# Patient Record
Sex: Female | Born: 1955 | Race: White | Hispanic: No | Marital: Married | Smoking: Never smoker
Health system: Southern US, Community
[De-identification: ages and names within clinical notes are randomized; demographics above are authoritative.]

## PROBLEM LIST (undated history)

## (undated) DIAGNOSIS — M8580 Other specified disorders of bone density and structure, unspecified site: Secondary | ICD-10-CM

## (undated) DIAGNOSIS — F32A Depression, unspecified: Secondary | ICD-10-CM

## (undated) DIAGNOSIS — T7840XA Allergy, unspecified, initial encounter: Secondary | ICD-10-CM

## (undated) DIAGNOSIS — I10 Essential (primary) hypertension: Secondary | ICD-10-CM

## (undated) DIAGNOSIS — M1990 Unspecified osteoarthritis, unspecified site: Secondary | ICD-10-CM

## (undated) DIAGNOSIS — N289 Disorder of kidney and ureter, unspecified: Secondary | ICD-10-CM

## (undated) DIAGNOSIS — E119 Type 2 diabetes mellitus without complications: Secondary | ICD-10-CM

## (undated) DIAGNOSIS — F419 Anxiety disorder, unspecified: Secondary | ICD-10-CM

## (undated) HISTORY — DX: Other specified disorders of bone density and structure, unspecified site: M85.80

## (undated) HISTORY — PX: APPENDECTOMY: SHX54

## (undated) HISTORY — PX: TONSILLECTOMY: SUR1361

## (undated) HISTORY — DX: Unspecified osteoarthritis, unspecified site: M19.90

## (undated) HISTORY — PX: MOUTH SURGERY: SHX715

## (undated) HISTORY — DX: Depression, unspecified: F32.A

## (undated) HISTORY — DX: Allergy, unspecified, initial encounter: T78.40XA

## (undated) HISTORY — DX: Type 2 diabetes mellitus without complications: E11.9

## (undated) HISTORY — PX: KNEE SURGERY: SHX244

## (undated) HISTORY — PX: ABLATION: SHX5711

## (undated) HISTORY — DX: Anxiety disorder, unspecified: F41.9

## (undated) HISTORY — PX: DILATION AND CURETTAGE OF UTERUS: SHX78

---

## 2019-12-16 ENCOUNTER — Emergency Department (HOSPITAL_COMMUNITY): Payer: BC Managed Care – PPO

## 2019-12-16 ENCOUNTER — Emergency Department (HOSPITAL_COMMUNITY)
Admission: EM | Admit: 2019-12-16 | Discharge: 2019-12-16 | Disposition: A | Discharge status: Home / Self Care | Payer: BC Managed Care – PPO | Attending: Emergency Medicine | Admitting: Emergency Medicine

## 2019-12-16 DIAGNOSIS — R531 Weakness: Secondary | ICD-10-CM | POA: Diagnosis not present

## 2019-12-16 DIAGNOSIS — R5381 Other malaise: Secondary | ICD-10-CM | POA: Diagnosis not present

## 2019-12-16 DIAGNOSIS — N189 Chronic kidney disease, unspecified: Secondary | ICD-10-CM | POA: Diagnosis not present

## 2019-12-16 DIAGNOSIS — R202 Paresthesia of skin: Secondary | ICD-10-CM | POA: Diagnosis present

## 2019-12-16 DIAGNOSIS — R200 Anesthesia of skin: Secondary | ICD-10-CM

## 2019-12-16 LAB — CBC WITH DIFFERENTIAL/PLATELET
Abs Immature Granulocytes: 0.01 10*3/uL (ref 0.00–0.07)
Basophils Absolute: 0 10*3/uL (ref 0.0–0.1)
Basophils Relative: 1 %
Eosinophils Absolute: 0.1 10*3/uL (ref 0.0–0.5)
Eosinophils Relative: 2 %
HCT: 37.2 % (ref 36.0–46.0)
Hemoglobin: 12.4 g/dL (ref 12.0–15.0)
Immature Granulocytes: 0 %
Lymphocytes Relative: 26 %
Lymphs Abs: 1.4 10*3/uL (ref 0.7–4.0)
MCH: 30.2 pg (ref 26.0–34.0)
MCHC: 33.3 g/dL (ref 30.0–36.0)
MCV: 90.7 fL (ref 80.0–100.0)
Monocytes Absolute: 0.4 10*3/uL (ref 0.1–1.0)
Monocytes Relative: 6 %
Neutro Abs: 3.6 10*3/uL (ref 1.7–7.7)
Neutrophils Relative %: 65 %
Platelets: 282 10*3/uL (ref 150–400)
RBC: 4.1 MIL/uL (ref 3.87–5.11)
RDW: 12.2 % (ref 11.5–15.5)
WBC: 5.5 10*3/uL (ref 4.0–10.5)
nRBC: 0 % (ref 0.0–0.2)

## 2019-12-16 LAB — BASIC METABOLIC PANEL
Anion gap: 10 (ref 5–15)
BUN: 25 mg/dL — ABNORMAL HIGH (ref 8–23)
CO2: 25 mmol/L (ref 22–32)
Calcium: 9.5 mg/dL (ref 8.9–10.3)
Chloride: 100 mmol/L (ref 98–111)
Creatinine, Ser: 1.88 mg/dL — ABNORMAL HIGH (ref 0.44–1.00)
GFR, Estimated: 29 mL/min — ABNORMAL LOW (ref >60)
Glucose, Bld: 155 mg/dL — ABNORMAL HIGH (ref 70–99)
Potassium: 4.2 mmol/L (ref 3.5–5.1)
Sodium: 135 mmol/L (ref 135–145)

## 2019-12-16 LAB — I-STAT CHEM 8, ED
BUN: 27 mg/dL — ABNORMAL HIGH (ref 8–23)
Calcium, Ion: 1.19 mmol/L (ref 1.15–1.40)
Chloride: 101 mmol/L (ref 98–111)
Creatinine, Ser: 2.1 mg/dL — ABNORMAL HIGH (ref 0.44–1.00)
Glucose, Bld: 152 mg/dL — ABNORMAL HIGH (ref 70–99)
HCT: 36 % (ref 36.0–46.0)
Hemoglobin: 12.2 g/dL (ref 12.0–15.0)
Potassium: 4.2 mmol/L (ref 3.5–5.1)
Sodium: 136 mmol/L (ref 135–145)
TCO2: 24 mmol/L (ref 22–32)

## 2019-12-16 MED ORDER — PREDNISONE 20 MG PO TABS: ORAL_TABLET | ORAL | 0 refills | Status: DC

## 2019-12-16 MED ORDER — LORAZEPAM 2 MG/ML IJ SOLN
0.5000 mg | Freq: Once | INTRAMUSCULAR | Status: AC
  Administered 2019-12-16: 0.5 mg via INTRAVENOUS
  Filled 2019-12-16: qty 1

## 2019-12-16 NOTE — Discharge Instructions (Signed)
You may have Bell's palsy so please try a course of steroids.  Of note your kidney function is slightly worse than previous.  Please follow-up with [facility]  You will need primary care doctor follow-up as well.  Please see who is in your network  Return to ER if you have worse numbness, weakness, trouble swallowing, trouble speaking

## 2019-12-16 NOTE — ED Provider Notes (Signed)
  Physical Exam  BP (!) 149/54 (BP Location: Right Arm)   Pulse 71   Temp 98.2 F (36.8 C) (Oral)   Resp 17   Ht 5\' 4"  (1.626 m)   Wt 73.5 kg   SpO2 95%   BMI 27.81 kg/m   Physical Exam  ED Course/Procedures   Clinical Course as of Dec 15 1657  Sun Dec 16, 2019  1349 Patient's laboratory tests are notable for elevated BUN and creatinine.  Patient does have a history of chronic kidney disease.   [JK]  1349 Doubt that this is related to acute issues.  No prior labs are available for comparison   [JK]  1350 CBC is normal.   [JK]  1350 Head CT without acute findings.   [JK]    Clinical Course User Index [JK] Dec 18, 2019, MD    Procedures  MDM  Care assumed at 3 PM from Dr. Linwood Dibbles.  Patient has right-sided facial numbness.  Patient is pending MRI brain and if is negative likely discharge home.  5:00 PM MRI did not show any acute changes.  Patient still has some numbness in the right side of her face.  There is no trouble closing her eyes or facial weakness.  I suspect mild Bell's palsy.  We will try a course of steroids.  Of note patient's creatinine is 2.1.  She lives in [place].  I was able to look at her creatinine through my chart and her baseline creatinine is 1.6.  Since she spends part of the time here with her daughter, I recommend that she follow-up with nephrology here.  She should also get a PCP in the area.     [place], MD 12/16/19 [phone]

## 2019-12-16 NOTE — ED Notes (Signed)
Patient and daughter verbalized understanding of AVS, f/u with nephrology and PCP, medication, and have no further questions at this time. Patient plans to call erh nephrologist in [facility] tomorrow morning. Patient is alert, oriented and reports no pain. Patient and daughter ambulated toward ED exit with all belongings.

## 2019-12-16 NOTE — ED Provider Notes (Signed)
[facility] EMERGENCY DEPARTMENT Provider Note   CSN: [id number] Arrival date & time: 12/16/19  1136     History Chief Complaint  Patient presents with  . Numbness  . Extremity Weakness    Bilateral     Jenna Caldwell is a 64 y.o. female.  HPI   Patient presents ED with complaints of right-sided facial numbness.  Patient states when she first woke up this morning she noticed that the right side of her face felt numb.  Felt as if she had been given an injection of Novocain at the dentist office.  She checked in the mirror but did not see any evidence of asymmetry of her face.  Patient also had a feeling of generalized malaise and weakness.  She felt that her entire body including bilateral lateral arms felt weak.  She also had some upset stomach with nausea but has not had any vomiting.  She denies any abdominal pain or chest pain.  She denies any headache.  No trouble with her speech or vision.  Patient asked her daughter to come over because she was not feeling well.  Her daughter recommended she come to the hospital to be checked out  Past medical history: Chronic kidney disease  Social history: No drugs or alcohol use  Family history: Noncontributory     OB History   No obstetric history on file.     No family history on file.  Social History   Tobacco Use  . Smoking status: Not on file  Substance Use Topics  . Alcohol use: Not on file  . Drug use: Not on file    Home Medications Prior to Admission medications   Not on File    Allergies    Patient has no allergy information on record.  Review of Systems   Review of Systems  All other systems reviewed and are negative.   Physical Exam Updated Vital Signs BP (!) 149/54 (BP Location: Right Arm)   Pulse 71   Temp 98.2 F (36.8 C) (Oral)   Resp 17   Ht 1.626 m (5\' 4" )   Wt 73.5 kg   SpO2 95%   BMI 27.81 kg/m   Physical Exam Vitals and nursing note reviewed.  Constitutional:       General: She is not in acute distress.    Appearance: She is well-developed.  HENT:     Head: Normocephalic and atraumatic.     Right Ear: External ear normal.     Left Ear: External ear normal.  Eyes:     General: No scleral icterus.       Right eye: No discharge.        Left eye: No discharge.     Conjunctiva/sclera: Conjunctivae normal.  Neck:     Trachea: No tracheal deviation.  Cardiovascular:     Rate and Rhythm: Normal rate and regular rhythm.  Pulmonary:     Effort: Pulmonary effort is normal. No respiratory distress.     Breath sounds: Normal breath sounds. No stridor. No wheezing or rales.  Abdominal:     General: Bowel sounds are normal. There is no distension.     Palpations: Abdomen is soft.     Tenderness: There is no abdominal tenderness. There is no guarding or rebound.  Musculoskeletal:        General: No tenderness.     Cervical back: Neck supple.  Skin:    General: Skin is warm and dry.  Findings: No rash.  Neurological:     Mental Status: She is alert and oriented to person, place, and time.     Cranial Nerves: No cranial nerve deficit (No facial droop, extraocular movements intact, tongue midline ).     Sensory: No sensory deficit.     Motor: No abnormal muscle tone or seizure activity.     Coordination: Coordination normal.     Comments: No pronator drift bilateral upper extrem, able to hold both legs off bed for 5 seconds, sensation intact in all extremities, no visual field cuts, no left or right sided neglect, normal finger-nose exam bilaterally, no nystagmus noted      ED Results / Procedures / Treatments   Labs (all labs ordered are listed, but only abnormal results are displayed) Labs Reviewed  BASIC METABOLIC PANEL - Abnormal; Notable for the following components:      Result Value   Glucose, Bld 155 (*)    BUN 25 (*)    Creatinine, Ser 1.88 (*)    GFR, Estimated 29 (*)    All other components within normal limits  I-STAT CHEM 8, ED -  Abnormal; Notable for the following components:   BUN 27 (*)    Creatinine, Ser 2.10 (*)    Glucose, Bld 152 (*)    All other components within normal limits  CBC WITH DIFFERENTIAL/PLATELET    EKG EKG Interpretation  Date/Time:  Sunday December 16 2019 11:40:42 EDT Ventricular Rate:  96 PR Interval:  186 QRS Duration: 70 QT Interval:  338 QTC Calculation: 427 R Axis:   70 Text Interpretation: Normal sinus rhythm Normal ECG No old tracing to compare Confirmed by Linwood Dibbles [phone]) on 12/16/2019 12:09:21 PM   Radiology CT Head Wo Contrast  Result Date: 12/16/2019 CLINICAL DATA:  Transient ischemic attack.  Numbness. EXAM: CT HEAD WITHOUT CONTRAST TECHNIQUE: Contiguous axial images were obtained from the base of the skull through the vertex without intravenous contrast. COMPARISON:  None. FINDINGS: Brain: No evidence of acute infarction, hemorrhage, hydrocephalus, extra-axial collection or mass lesion/mass effect. Vascular: No hyperdense vessel or unexpected calcification. Skull: Normal. Negative for fracture or focal lesion. Sinuses/Orbits: No acute finding. Other: None. IMPRESSION: Normal study. Electronically Signed   By: Gerome Sam III M.D   On: 12/16/2019 13:09    Procedures Procedures (including critical care time)  Medications Ordered in ED Medications  LORazepam (ATIVAN) injection 0.5 mg (0.5 mg Intravenous Given 12/16/19 1523)    ED Course  I have reviewed the triage vital signs and the nursing notes.  Pertinent labs & imaging results that were available during my care of the patient were reviewed by me and considered in my medical decision making (see chart for details).  Clinical Course as of Dec 16 1539  Sun Dec 16, 2019  1349 Patient's laboratory tests are notable for elevated BUN and creatinine.  Patient does have a history of chronic kidney disease.   [JK]  1349 Doubt that this is related to acute issues.  No prior labs are available for comparison   [JK]   1350 CBC is normal.   [JK]  1350 Head CT without acute findings.   [JK]    Clinical Course User Index [JK] Linwood Dibbles, MD   MDM Rules/Calculators/A&P                          Patient presented to the emergency room with complaints of facial numbness.  Patient had some  generalized malaise.  In the ED the patient's exam is reassuring.  She does not have any focal neurologic deficits other than subjective numbness in her right cheek.  Laboratory tests and head CT otherwise unremarkable.  Discussed findings with patient.  I do think an MRI is indicated to rule out any occult stroke not evident on the CT scan.  Patient agrees to this plan.  If her MRI is negative anticipate she can be discharged.  Dr Silverio Lay to follow up on MRI Final Clinical Impression(s) / ED Diagnoses Final diagnoses:  Facial numbness      Linwood Dibbles, MD 12/16/19 1542

## 2019-12-16 NOTE — ED Notes (Signed)
Patient to MRI on stretcher w transport tech. Daughter has all jewelry. Patient alert and oriented. Pre medicated for anxiolysis.

## 2019-12-16 NOTE — ED Triage Notes (Signed)
Reported numbness on right side of face, and feels right side of mouth droopy. "It's like when novacaine is wearing off" however denies any recent dentl procedure. Patient c/o bilateral arm weakness as well; and "my stomach is upset". Stated symptoms started around 0930 this morning.

## 2020-03-10 ENCOUNTER — Emergency Department (HOSPITAL_BASED_OUTPATIENT_CLINIC_OR_DEPARTMENT_OTHER)
Admission: EM | Admit: 2020-03-10 | Discharge: 2020-03-10 | Disposition: A | Discharge status: Home / Self Care | Payer: BC Managed Care – PPO | Source: Home / Self Care | Attending: Emergency Medicine | Admitting: Emergency Medicine

## 2020-03-10 ENCOUNTER — Emergency Department (HOSPITAL_BASED_OUTPATIENT_CLINIC_OR_DEPARTMENT_OTHER): Payer: BC Managed Care – PPO

## 2020-03-10 ENCOUNTER — Encounter (HOSPITAL_COMMUNITY): Payer: Self-pay | Admitting: *Deleted

## 2020-03-10 ENCOUNTER — Emergency Department (HOSPITAL_COMMUNITY)
Admission: EM | Admit: 2020-03-10 | Discharge: 2020-03-10 | Disposition: A | Discharge status: Home / Self Care | Payer: BC Managed Care – PPO | Attending: Emergency Medicine | Admitting: Emergency Medicine

## 2020-03-10 ENCOUNTER — Encounter (HOSPITAL_BASED_OUTPATIENT_CLINIC_OR_DEPARTMENT_OTHER): Payer: Self-pay | Admitting: *Deleted

## 2020-03-10 ENCOUNTER — Other Ambulatory Visit: Payer: Self-pay

## 2020-03-10 DIAGNOSIS — I10 Essential (primary) hypertension: Secondary | ICD-10-CM | POA: Insufficient documentation

## 2020-03-10 DIAGNOSIS — S01511A Laceration without foreign body of lip, initial encounter: Secondary | ICD-10-CM | POA: Insufficient documentation

## 2020-03-10 DIAGNOSIS — Z79899 Other long term (current) drug therapy: Secondary | ICD-10-CM | POA: Insufficient documentation

## 2020-03-10 DIAGNOSIS — Z5321 Procedure and treatment not carried out due to patient leaving prior to being seen by health care provider: Secondary | ICD-10-CM | POA: Insufficient documentation

## 2020-03-10 DIAGNOSIS — E871 Hypo-osmolality and hyponatremia: Secondary | ICD-10-CM | POA: Diagnosis not present

## 2020-03-10 DIAGNOSIS — R55 Syncope and collapse: Secondary | ICD-10-CM | POA: Insufficient documentation

## 2020-03-10 DIAGNOSIS — W19XXXA Unspecified fall, initial encounter: Secondary | ICD-10-CM | POA: Diagnosis not present

## 2020-03-10 DIAGNOSIS — S0993XA Unspecified injury of face, initial encounter: Secondary | ICD-10-CM | POA: Diagnosis present

## 2020-03-10 HISTORY — DX: Disorder of kidney and ureter, unspecified: N28.9

## 2020-03-10 HISTORY — DX: Essential (primary) hypertension: I10

## 2020-03-10 LAB — COMPREHENSIVE METABOLIC PANEL
ALT: 10 U/L (ref 0–44)
AST: 15 U/L (ref 15–41)
Albumin: 4.1 g/dL (ref 3.5–5.0)
Alkaline Phosphatase: 57 U/L (ref 38–126)
Anion gap: 10 (ref 5–15)
BUN: 21 mg/dL (ref 8–23)
CO2: 24 mmol/L (ref 22–32)
Calcium: 8.8 mg/dL — ABNORMAL LOW (ref 8.9–10.3)
Chloride: 93 mmol/L — ABNORMAL LOW (ref 98–111)
Creatinine, Ser: 1.73 mg/dL — ABNORMAL HIGH (ref 0.44–1.00)
GFR, Estimated: 32 mL/min — ABNORMAL LOW (ref >60)
Glucose, Bld: 134 mg/dL — ABNORMAL HIGH (ref 70–99)
Potassium: 4.3 mmol/L (ref 3.5–5.1)
Sodium: 127 mmol/L — ABNORMAL LOW (ref 135–145)
Total Bilirubin: 0.5 mg/dL (ref 0.3–1.2)
Total Protein: 6.6 g/dL (ref 6.5–8.1)

## 2020-03-10 LAB — CBC WITH DIFFERENTIAL/PLATELET
Abs Immature Granulocytes: 0.03 10*3/uL (ref 0.00–0.07)
Basophils Absolute: 0 10*3/uL (ref 0.0–0.1)
Basophils Relative: 0 %
Eosinophils Absolute: 0 10*3/uL (ref 0.0–0.5)
Eosinophils Relative: 0 %
HCT: 34.5 % — ABNORMAL LOW (ref 36.0–46.0)
Hemoglobin: 12.3 g/dL (ref 12.0–15.0)
Immature Granulocytes: 0 %
Lymphocytes Relative: 8 %
Lymphs Abs: 0.7 10*3/uL (ref 0.7–4.0)
MCH: 30.9 pg (ref 26.0–34.0)
MCHC: 35.7 g/dL (ref 30.0–36.0)
MCV: 86.7 fL (ref 80.0–100.0)
Monocytes Absolute: 0.4 10*3/uL (ref 0.1–1.0)
Monocytes Relative: 5 %
Neutro Abs: 7.4 10*3/uL (ref 1.7–7.7)
Neutrophils Relative %: 87 %
Platelets: 274 10*3/uL (ref 150–400)
RBC: 3.98 MIL/uL (ref 3.87–5.11)
RDW: 11.5 % (ref 11.5–15.5)
WBC: 8.6 10*3/uL (ref 4.0–10.5)
nRBC: 0 % (ref 0.0–0.2)

## 2020-03-10 LAB — BASIC METABOLIC PANEL
Anion gap: 11 (ref 5–15)
BUN: 16 mg/dL (ref 8–23)
CO2: 22 mmol/L (ref 22–32)
Calcium: 9 mg/dL (ref 8.9–10.3)
Chloride: 93 mmol/L — ABNORMAL LOW (ref 98–111)
Creatinine, Ser: 1.69 mg/dL — ABNORMAL HIGH (ref 0.44–1.00)
GFR, Estimated: 33 mL/min — ABNORMAL LOW (ref >60)
Glucose, Bld: 142 mg/dL — ABNORMAL HIGH (ref 70–99)
Potassium: 4.1 mmol/L (ref 3.5–5.1)
Sodium: 126 mmol/L — ABNORMAL LOW (ref 135–145)

## 2020-03-10 LAB — CBC
HCT: 36.3 % (ref 36.0–46.0)
Hemoglobin: 12.3 g/dL (ref 12.0–15.0)
MCH: 30.4 pg (ref 26.0–34.0)
MCHC: 33.9 g/dL (ref 30.0–36.0)
MCV: 89.9 fL (ref 80.0–100.0)
Platelets: 264 10*3/uL (ref 150–400)
RBC: 4.04 MIL/uL (ref 3.87–5.11)
RDW: 11.5 % (ref 11.5–15.5)
WBC: 8.1 10*3/uL (ref 4.0–10.5)
nRBC: 0 % (ref 0.0–0.2)

## 2020-03-10 LAB — TROPONIN I (HIGH SENSITIVITY)
Troponin I (High Sensitivity): <2 ng/L (ref <18)
Troponin I (High Sensitivity): <2 ng/L (ref <18)

## 2020-03-10 LAB — LIPASE, BLOOD: Lipase: 32 U/L (ref 11–51)
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
## 2020-03-10 NOTE — ED Triage Notes (Signed)
Pt reports ongoing dizziness when showering, occurs when lifting her hands above her head. Has sensation she has to have bowel movement. Normally able to get out of shower and lay down. Today had +syncopal episode while in shower. Has abrasions noted to nose and laceration to upper lip.

## 2020-03-10 NOTE — ED Notes (Signed)
Pt on monitor and vitals cycling 

## 2020-03-23 NOTE — Progress Notes (Signed)
Cardiology Office Note:    Date:  03/25/2020   ID:  Jenna Caldwell, DOB 04/22/1955, MRN [id number]  PCP:  Patient, No Pcp Per  [facility] Cardiologist:  Meriam Sprague, MD  [facility] Electrophysiologist:  None   Referring MD: Laurence Spates, *    History of Present Illness:    Jenna Caldwell is a 65 y.o. female with a hx of HTN, pre-diabetes, and CKD who was referred by Frederick Peers for further evaluation of syncope.  Patient recently presented to [facility] ED with possible syncope after feeling lightheaded and dizzy in the shower. Prior to passing out, she felt nauseas and like she had a BM. No chest pain, SOB, palpitations. No known cardiac issues. In the ER, CT head negative for acute pathology. Carotid ultrasound without dissection or significant stenosis. Was referred to Cards for further management.  Today, the patient states that she frequently feels nauseas/lightheaded in the shower especially when she would bend back to wash her hair. On several occasions, she had to get out of the shower and lay down to prevent her from losing consciousness. This is not unusual for her as she has had similar symptoms throughout her life. Specifically, she has almost fainted in the physician's office when reviewing a procedure or imaging test. This time was just different because she lost consciousness. Notably in the ER her Na was low at 127. She is on a no salt/sugar diet due to CKD and attempts to lose weight. After her ER visit, she was instructed to increase her salt intake a little bit and repeat labs showed improvement of her Na to 138. The patient is very concerned about her renal function and does not want to do anything to disrupt her electrolytes or creatinine.   Otherwise, she has no known personal history of heart disease. She is active without exertional symptoms. She does have a history of palpitations and holter performed in [place] was reassuringly normal. She was placed on  metoprolol at that time with complete resolution. Her blood pressure medication has also been lowered as she has lost 46lbs over the past 1.5 years.  Otherwise, no DOE, orthopnea, PND, fevers or chills.  Family history: Mother with history of DMII, MI at age 74; Grandmother with MI after delivery of her child at age 31, passed away from heart failure.   TC 174, TG 138, HDL 57, LDL 93 Na now 138, K 4.6, BUN 21  Past Medical History:  Diagnosis Date  . Hypertension   . Renal disease     History reviewed. No pertinent surgical history.  Current Medications: Current Meds  Medication Sig  . Calcium 200 MG TABS Take 2 tablets by mouth daily at 12 noon.  . Cholecalciferol (VITAMIN D3 PO) Take 1 tablet by mouth daily at 12 noon.  . Cyanocobalamin (VITAMIN B 12 PO) Take 1 tablet by mouth daily at 12 noon.  Marland Kitchen imipramine (TOFRANIL) 50 MG tablet Take 1 tablet by mouth daily.  Marland Kitchen lisinopril (ZESTRIL) 2.5 MG tablet Take 2.5 mg by mouth daily.  Marland Kitchen METOPROLOL SUCCINATE PO Take 25 mg by mouth.     Allergies:   Metformin and related and Sulfa antibiotics   Social History   Socioeconomic History  . Marital status: Married    Spouse name: Not on file  . Number of children: Not on file  . Years of education: Not on file  . Highest education level: Not on file  Occupational History  . Not  on file  Tobacco Use  . Smoking status: Never Smoker  . Smokeless tobacco: Never Used  Substance and Sexual Activity  . Alcohol use: Never  . Drug use: Never  . Sexual activity: Not on file  Other Topics Concern  . Not on file  Social History Narrative  . Not on file   Social Determinants of Health   Financial Resource Strain: Not on file  Food Insecurity: Not on file  Transportation Needs: Not on file  Physical Activity: Not on file  Stress: Not on file  Social Connections: Not on file     Family History: Mother with history of DMII, MI at age 22; Grandmother with MI after delivery of her child  at age 62, passed away from heart failure.   ROS:   Please see the history of present illness.    Review of Systems  Constitutional: Negative for chills and fever.  HENT: Negative for nosebleeds.   Eyes: Negative for blurred vision.  Respiratory: Negative for sputum production and shortness of breath.   Cardiovascular: Negative for chest pain, palpitations, orthopnea, claudication, leg swelling and PND.  Gastrointestinal: Positive for nausea. Negative for blood in stool and vomiting.  Genitourinary: Negative for dysuria.  Musculoskeletal: Positive for joint pain.  Neurological: Positive for dizziness and loss of consciousness.  Endo/Heme/Allergies: Negative for polydipsia.  Psychiatric/Behavioral: Negative for substance abuse.    EKGs/Labs/Other Studies Reviewed:    The following studies were reviewed today: Carotid Ultrasound 03/10/20: FINDINGS: Criteria: Quantification of carotid stenosis is based on velocity parameters that correlate the residual internal carotid diameter with NASCET-based stenosis levels, using the diameter of the distal internal carotid lumen as the denominator for stenosis measurement.  The following velocity measurements were obtained:  RIGHT ICA: 84/27 cm/sec CCA: 86/17 cm/sec  SYSTOLIC ICA/CCA RATIO:  1.0  ECA:  71 cm/sec  LEFT  ICA: 77/31 cm/sec  CCA: 100/26 cm/sec  SYSTOLIC ICA/CCA RATIO:  0.8  ECA:  84 cm/sec  RIGHT CAROTID ARTERY: No significant atherosclerotic plaque or evidence of stenosis in the internal carotid artery.  RIGHT VERTEBRAL ARTERY:  Patent with normal antegrade flow.  LEFT CAROTID ARTERY: No significant atherosclerotic plaque or stenosis in the internal carotid artery.  LEFT VERTEBRAL ARTERY:  Patent with normal antegrade flow.  IMPRESSION: Negative bilateral carotid duplex ultrasound.  EKG:  EKG 03/11/20: NSR with no ischemia or block  Recent Labs: 03/10/2020: ALT 10; BUN 21; Creatinine, Ser  1.73; Hemoglobin 12.3; Platelets 274; Potassium 4.3; Sodium 127  Recent Lipid Panel No results found for: CHOL, TRIG, HDL, CHOLHDL, VLDL, LDLCALC, LDLDIRECT2   Physical Exam:    VS:  BP 122/74   Pulse 94   Ht 5\' 4"  (1.626 m)   Wt 160 lb 12.8 oz (72.9 kg)   SpO2 97%   BMI 27.60 kg/m     Wt Readings from Last 3 Encounters:  03/25/20 160 lb 12.8 oz (72.9 kg)  03/10/20 162 lb (73.5 kg)  03/10/20 162 lb (73.5 kg)     GEN:  Well nourished, well developed in no acute distress HEENT: Normal NECK: No JVD; No carotid bruits CARDIAC: RRR, no murmurs, rubs, gallops RESPIRATORY:  Clear to auscultation without rales, wheezing or rhonchi  ABDOMEN: Soft, non-tender, non-distended MUSCULOSKELETAL:  No edema; No deformity  SKIN: Warm and dry NEUROLOGIC:  Alert and oriented x 3 PSYCHIATRIC:  Normal affect   ASSESSMENT:    1. Syncope, unspecified syncope type   2. Chest pain, unspecified type   3. Stage  3b chronic kidney disease (HCC)   4. Primary hypertension    PLAN:    In order of problems listed above:  #Vasovagal Syncope: Patients symptoms consistent with vasovagal syncope with the sensation of lightheadedness, nausea and generalized malaise while in a hot shower. Has history of similar symptoms with showering in the past as well as situational near syncope in physician's office. No chest pain or SOB. No known cardiac disease. Holter by primary in [place] normal and patient has no current palpitations. Likely symptoms exacerbated by dehydration/orthostasis with Na 127 in ED which improved when she increased her salt intake and hydration with gatorade. -Check TTE -Counseled about importance of adequate fluid and salt intake--okay for her to have some salt in her diet despite CKD (told her 2g salt/day is reasonable for her as she is currently on a no salt diet) -Compression stockings/abdominal binders -Avoid prolonged, hot showers -If patient feels like she is going to faint, she  needs to lay down and place her feet up -Will check blood pressures at home and ensure she is not having SBP<100 as hypotension will worsen symptoms  #CKD IIIB: Cr 1.73 at baseline. Patient is very diligent about monitoring her diet and electrolytes to ensure her numbers remain stable. -Follow-up with PCP as scheduled -Okay for small amounts of Na in her diet -Renally dose all medications  #HTN: Well controlled in clinic. -Continue lisinopril 2.5mg  daily -Continue metop 25mg  daily -If blood pressure low at home, can cut back on antihypertensives  #Risk Stratification: #Family History of CAD: -Will check coronary calcium score     Medication Adjustments/Labs and Tests Ordered: Current medicines are reviewed at length with the patient today.  Concerns regarding medicines are outlined above.  Orders Placed This Encounter  Procedures  . CT CARDIAC SCORING (SELF PAY ONLY)  . ECHOCARDIOGRAM COMPLETE   No orders of the defined types were placed in this encounter.   Patient Instructions   Medication Instructions:  Your physician recommends that you continue on your current medications as directed. Please refer to the Current Medication list given to you today.   *If you need a refill on your cardiac medications before your next appointment, please call your pharmacy*   Lab Work: none If you have labs (blood work) drawn today and your tests are completely normal, you will receive your results only by: Marland Kitchen MyChart Message (if you have MyChart) OR . A paper copy in the mail If you have any lab test that is abnormal or we need to change your treatment, we will call you to review the results.   Testing/Procedures: Dr Shari Prows would like you to have a Calcium Score CT scan  Your physician has requested that you have an echocardiogram. Echocardiography is a painless test that uses sound waves to create images of your heart. It provides your doctor with information about the size and  shape of your heart and how well your heart's chambers and valves are working. This procedure takes approximately one hour. There are no restrictions for this procedure.     Follow-Up: At [facility], you and your health needs are our priority.  As part of our continuing mission to provide you with exceptional heart care, we have created designated Provider Care Teams.  These Care Teams include your primary Cardiologist (physician) and Advanced Practice Providers (APPs -  Physician Assistants and Nurse Practitioners) who all work together to provide you with the care you need, when you need it.  We recommend  signing up for the patient portal called "MyChart".  Sign up information is provided on this After Visit Summary.  MyChart is used to connect with patients for Virtual Visits (Telemedicine).  Patients are able to view lab/test results, encounter notes, upcoming appointments, etc.  Non-urgent messages can be sent to your provider as well.   To learn more about what you can do with MyChart, go to [email or web address].    Your next appointment:   3 months  The format for your next appointment:   In person  Provider:   Dr Shari ProwsPemberton   Other Instructions   Orthostatic Hypotension Blood pressure is a measurement of how strongly, or weakly, your blood is pressing against the walls of your arteries. Orthostatic hypotension is a sudden drop in blood pressure that happens when you quickly change positions, such as when you get up from sitting or lying down. Arteries are blood vessels that carry blood from your heart throughout your body. When blood pressure is too low, you may not get enough blood to your brain or to the rest of your organs. This can cause weakness, light-headedness, rapid heartbeat, and fainting. This can last for just a few seconds or for up to a few minutes. Orthostatic hypotension is usually not a serious problem. However, if it happens frequently or gets worse, it may be  a sign of something more serious. What are the causes? This condition may be caused by:  Sudden changes in posture, such as standing up quickly after you have been sitting or lying down.  Blood loss.  Loss of body fluids (dehydration).  Heart problems.  Hormone (endocrine) problems.  Pregnancy.  Severe infection.  Lack of certain nutrients.  Severe allergic reactions (anaphylaxis).  Certain medicines, such as blood pressure medicine or medicines that make the body lose excess fluids (diuretics). Sometimes, this condition can be caused by not taking medicine as directed, such as taking too much of a certain medicine. What increases the risk? The following factors may make you more likely to develop this condition:  Age. Risk increases as you get older.  Conditions that affect the heart or the central nervous system.  Taking certain medicines, such as blood pressure medicine or diuretics.  Being pregnant. What are the signs or symptoms? Symptoms of this condition may include:  Weakness.  Light-headedness.  Dizziness.  Blurred vision.  Fatigue.  Rapid heartbeat.  Fainting, in severe cases. How is this diagnosed? This condition is diagnosed based on:  Your medical history.  Your symptoms.  Your blood pressure measurement. Your health care provider will check your blood pressure when you are: ? Lying down. ? Sitting. ? Standing. A blood pressure reading is recorded as two numbers, such as "120 over 80" (or 120/80). The first ("top") number is called the systolic pressure. It is a measure of the pressure in your arteries as your heart beats. The second ("bottom") number is called the diastolic pressure. It is a measure of the pressure in your arteries when your heart relaxes between beats. Blood pressure is measured in a unit called mm Hg. Healthy blood pressure for most adults is 120/80. If your blood pressure is below 90/60, you may be diagnosed with  hypotension. Other information or tests that may be used to diagnose orthostatic hypotension include:  Your other vital signs, such as your heart rate and temperature.  Blood tests.  Tilt table test. For this test, you will be safely secured to a table that moves you from a  lying position to an upright position. Your heart rhythm and blood pressure will be monitored during the test. How is this treated? This condition may be treated by:  Changing your diet. This may involve eating more salt (sodium) or drinking more water.  Taking medicines to raise your blood pressure.  Changing the dosage of certain medicines you are taking that might be lowering your blood pressure.  Wearing compression stockings. These stockings help to prevent blood clots and reduce swelling in your legs. In some cases, you may need to go to the hospital for:  Fluid replacement. This means you will receive fluids through an IV.  Blood replacement. This means you will receive donated blood through an IV (transfusion).  Treating an infection or heart problems, if this applies.  Monitoring. You may need to be monitored while medicines that you are taking wear off. Follow these instructions at home: Eating and drinking  Drink enough fluid to keep your urine pale yellow.  Eat a healthy diet, and follow instructions from your health care provider about eating or drinking restrictions. A healthy diet includes: ? Fresh fruits and vegetables. ? Whole grains. ? Lean meats. ? Low-fat dairy products.  Eat extra salt only as directed. Do not add extra salt to your diet unless your health care provider told you to do that.  Eat frequent, small meals.  Avoid standing up suddenly after eating.   Medicines  Take over-the-counter and prescription medicines only as told by your health care provider. ? Follow instructions from your health care provider about changing the dosage of your current medicines, if this  applies. ? Do not stop or adjust any of your medicines on your own. General instructions  Wear compression stockings as told by your health care provider.  Get up slowly from lying down or sitting positions. This gives your blood pressure a chance to adjust.  Avoid hot showers and excessive heat as directed by your health care provider.  Return to your normal activities as told by your health care provider. Ask your health care provider what activities are safe for you.  Do not use any products that contain nicotine or tobacco, such as cigarettes, e-cigarettes, and chewing tobacco. If you need help quitting, ask your health care provider.  Keep all follow-up visits as told by your health care provider. This is important.   Contact a health care provider if you:  Vomit.  Have diarrhea.  Have a fever for more than 2-3 days.  Feel more thirsty than usual.  Feel weak and tired. Get help right away if you:  Have chest pain.  Have a fast or irregular heartbeat.  Develop numbness in any part of your body.  Cannot move your arms or your legs.  Have trouble speaking.  Become sweaty or feel light-headed.  Faint.  Feel short of breath.  Have trouble staying awake.  Feel confused. Summary  Orthostatic hypotension is a sudden drop in blood pressure that happens when you quickly change positions.  Orthostatic hypotension is usually not a serious problem.  It is diagnosed by having your blood pressure taken lying down, sitting, and then standing.  It may be treated by changing your diet or adjusting your medicines. This information is not intended to replace advice given to you by your health care provider. Make sure you discuss any questions you have with your health care provider. Document Revised: 07/28/2017 Document Reviewed: 07/28/2017 Elsevier Patient Education  2021 [employer].  Signed, Meriam Sprague, MD  03/25/2020 9:13 AM    [facility]

## 2020-03-25 ENCOUNTER — Other Ambulatory Visit: Payer: Self-pay

## 2020-03-25 ENCOUNTER — Encounter: Payer: Self-pay | Admitting: Cardiology

## 2020-03-25 ENCOUNTER — Ambulatory Visit (INDEPENDENT_AMBULATORY_CARE_PROVIDER_SITE_OTHER): Payer: BC Managed Care – PPO | Admitting: Cardiology

## 2020-03-25 VITALS — BP 122/74 | HR 94 | Ht 64.0 in | Wt 160.8 lb

## 2020-03-25 DIAGNOSIS — R079 Chest pain, unspecified: Secondary | ICD-10-CM | POA: Diagnosis not present

## 2020-03-25 DIAGNOSIS — N1832 Chronic kidney disease, stage 3b: Secondary | ICD-10-CM | POA: Diagnosis not present

## 2020-03-25 DIAGNOSIS — I10 Essential (primary) hypertension: Secondary | ICD-10-CM

## 2020-03-25 DIAGNOSIS — R55 Syncope and collapse: Secondary | ICD-10-CM

## 2020-03-25 NOTE — Patient Instructions (Addendum)
Medication Instructions:  Your physician recommends that you continue on your current medications as directed. Please refer to the Current Medication list given to you today.   *If you need a refill on your cardiac medications before your next appointment, please call your pharmacy*   Lab Work: none If you have labs (blood work) drawn today and your tests are completely normal, you will receive your results only by: Marland Kitchen MyChart Message (if you have MyChart) OR . A paper copy in the mail If you have any lab test that is abnormal or we need to change your treatment, we will call you to review the results.   Testing/Procedures: Dr Shari Prows would like you to have a Calcium Score CT scan  Your physician has requested that you have an echocardiogram. Echocardiography is a painless test that uses sound waves to create images of your heart. It provides your doctor with information about the size and shape of your heart and how well your heart's chambers and valves are working. This procedure takes approximately one hour. There are no restrictions for this procedure.     Follow-Up: At [facility], you and your health needs are our priority.  As part of our continuing mission to provide you with exceptional heart care, we have created designated Provider Care Teams.  These Care Teams include your primary Cardiologist (physician) and Advanced Practice Providers (APPs -  Physician Assistants and Nurse Practitioners) who all work together to provide you with the care you need, when you need it.  We recommend signing up for the patient portal called "MyChart".  Sign up information is provided on this After Visit Summary.  MyChart is used to connect with patients for Virtual Visits (Telemedicine).  Patients are able to view lab/test results, encounter notes, upcoming appointments, etc.  Non-urgent messages can be sent to your provider as well.   To learn more about what you can do with MyChart, go to  [email or web address].    Your next appointment:   3 months  The format for your next appointment:   In person  Provider:   Dr Shari Prows   Other Instructions   Orthostatic Hypotension Blood pressure is a measurement of how strongly, or weakly, your blood is pressing against the walls of your arteries. Orthostatic hypotension is a sudden drop in blood pressure that happens when you quickly change positions, such as when you get up from sitting or lying down. Arteries are blood vessels that carry blood from your heart throughout your body. When blood pressure is too low, you may not get enough blood to your brain or to the rest of your organs. This can cause weakness, light-headedness, rapid heartbeat, and fainting. This can last for just a few seconds or for up to a few minutes. Orthostatic hypotension is usually not a serious problem. However, if it happens frequently or gets worse, it may be a sign of something more serious. What are the causes? This condition may be caused by:  Sudden changes in posture, such as standing up quickly after you have been sitting or lying down.  Blood loss.  Loss of body fluids (dehydration).  Heart problems.  Hormone (endocrine) problems.  Pregnancy.  Severe infection.  Lack of certain nutrients.  Severe allergic reactions (anaphylaxis).  Certain medicines, such as blood pressure medicine or medicines that make the body lose excess fluids (diuretics). Sometimes, this condition can be caused by not taking medicine as directed, such as taking too much of a certain medicine. What  increases the risk? The following factors may make you more likely to develop this condition:  Age. Risk increases as you get older.  Conditions that affect the heart or the central nervous system.  Taking certain medicines, such as blood pressure medicine or diuretics.  Being pregnant. What are the signs or symptoms? Symptoms of this condition may  include:  Weakness.  Light-headedness.  Dizziness.  Blurred vision.  Fatigue.  Rapid heartbeat.  Fainting, in severe cases. How is this diagnosed? This condition is diagnosed based on:  Your medical history.  Your symptoms.  Your blood pressure measurement. Your health care provider will check your blood pressure when you are: ? Lying down. ? Sitting. ? Standing. A blood pressure reading is recorded as two numbers, such as "120 over 80" (or 120/80). The first ("top") number is called the systolic pressure. It is a measure of the pressure in your arteries as your heart beats. The second ("bottom") number is called the diastolic pressure. It is a measure of the pressure in your arteries when your heart relaxes between beats. Blood pressure is measured in a unit called mm Hg. Healthy blood pressure for most adults is 120/80. If your blood pressure is below 90/60, you may be diagnosed with hypotension. Other information or tests that may be used to diagnose orthostatic hypotension include:  Your other vital signs, such as your heart rate and temperature.  Blood tests.  Tilt table test. For this test, you will be safely secured to a table that moves you from a lying position to an upright position. Your heart rhythm and blood pressure will be monitored during the test. How is this treated? This condition may be treated by:  Changing your diet. This may involve eating more salt (sodium) or drinking more water.  Taking medicines to raise your blood pressure.  Changing the dosage of certain medicines you are taking that might be lowering your blood pressure.  Wearing compression stockings. These stockings help to prevent blood clots and reduce swelling in your legs. In some cases, you may need to go to the hospital for:  Fluid replacement. This means you will receive fluids through an IV.  Blood replacement. This means you will receive donated blood through an IV  (transfusion).  Treating an infection or heart problems, if this applies.  Monitoring. You may need to be monitored while medicines that you are taking wear off. Follow these instructions at home: Eating and drinking  Drink enough fluid to keep your urine pale yellow.  Eat a healthy diet, and follow instructions from your health care provider about eating or drinking restrictions. A healthy diet includes: ? Fresh fruits and vegetables. ? Whole grains. ? Lean meats. ? Low-fat dairy products.  Eat extra salt only as directed. Do not add extra salt to your diet unless your health care provider told you to do that.  Eat frequent, small meals.  Avoid standing up suddenly after eating.   Medicines  Take over-the-counter and prescription medicines only as told by your health care provider. ? Follow instructions from your health care provider about changing the dosage of your current medicines, if this applies. ? Do not stop or adjust any of your medicines on your own. General instructions  Wear compression stockings as told by your health care provider.  Get up slowly from lying down or sitting positions. This gives your blood pressure a chance to adjust.  Avoid hot showers and excessive heat as directed by your health care  provider.  Return to your normal activities as told by your health care provider. Ask your health care provider what activities are safe for you.  Do not use any products that contain nicotine or tobacco, such as cigarettes, e-cigarettes, and chewing tobacco. If you need help quitting, ask your health care provider.  Keep all follow-up visits as told by your health care provider. This is important.   Contact a health care provider if you:  Vomit.  Have diarrhea.  Have a fever for more than 2-3 days.  Feel more thirsty than usual.  Feel weak and tired. Get help right away if you:  Have chest pain.  Have a fast or irregular heartbeat.  Develop  numbness in any part of your body.  Cannot move your arms or your legs.  Have trouble speaking.  Become sweaty or feel light-headed.  Faint.  Feel short of breath.  Have trouble staying awake.  Feel confused. Summary  Orthostatic hypotension is a sudden drop in blood pressure that happens when you quickly change positions.  Orthostatic hypotension is usually not a serious problem.  It is diagnosed by having your blood pressure taken lying down, sitting, and then standing.  It may be treated by changing your diet or adjusting your medicines. This information is not intended to replace advice given to you by your health care provider. Make sure you discuss any questions you have with your health care provider. Document Revised: 07/28/2017 Document Reviewed: 07/28/2017 Elsevier Patient Education  2021 [employer].

## 2020-04-02 ENCOUNTER — Telehealth: Payer: BC Managed Care – PPO | Admitting: Physician Assistant

## 2020-04-02 DIAGNOSIS — R300 Dysuria: Secondary | ICD-10-CM | POA: Diagnosis not present

## 2020-04-02 MED ORDER — CEPHALEXIN 500 MG PO CAPS: 500.0000 mg | ORAL_CAPSULE | Freq: Four times a day (QID) | ORAL | 0 refills | Status: AC

## 2020-04-02 NOTE — Progress Notes (Signed)
We are sorry that you are not feeling well.  Here is how we plan to help!  Based on what you shared with me it looks like you most likely have a simple urinary tract infection.  A UTI (Urinary Tract Infection) is a bacterial infection of the bladder.  Most cases of urinary tract infections are simple to treat but a key part of your care is to encourage you to drink plenty of fluids and watch your symptoms carefully.  I have prescribed Keflex 500mg  four times daily for the next 7 days.  Your symptoms should gradually improve. Call if the burning in your urine worsens, you develop worsening fever, back pain or pelvic pain or if your symptoms do not resolve after completing the antibiotic.  Urinary tract infections can be prevented by drinking plenty of water to keep your body hydrated.  Also be sure when you wipe, wipe from front to back and don't hold it in!  If possible, empty your bladder every 4 hours.  Your e-visit answers were reviewed by a board certified advanced clinical practitioner to complete your personal care plan.  Depending on the condition, your plan could have included both over the counter or prescription medications.  If there is a problem please reply  once you have received a response from your provider.  Your safety is important to [place].  If you have drug allergies check your prescription carefully.    You can use MyChart to ask questions about today's visit, request a non-urgent call back, or ask for a work or school excuse for 24 hours related to this e-Visit. If it has been greater than 24 hours you will need to follow up with your provider, or enter a new e-Visit to address those concerns.   You will get an e-mail in the next two days asking about your experience.  I hope that your e-visit has been valuable and will speed your recovery. Thank you for using e-visits.   Approximately 5 minutes was spent documenting and reviewing patient's chart.

## 2020-04-17 ENCOUNTER — Ambulatory Visit (HOSPITAL_COMMUNITY): Payer: BC Managed Care – PPO | Attending: Cardiology

## 2020-04-17 ENCOUNTER — Other Ambulatory Visit: Payer: Self-pay

## 2020-04-17 ENCOUNTER — Ambulatory Visit (INDEPENDENT_AMBULATORY_CARE_PROVIDER_SITE_OTHER)
Admission: RE | Admit: 2020-04-17 | Discharge: 2020-04-17 | Disposition: A | Discharge status: Home / Self Care | Payer: Self-pay | Source: Ambulatory Visit | Attending: Cardiology | Admitting: Cardiology

## 2020-04-17 DIAGNOSIS — R079 Chest pain, unspecified: Secondary | ICD-10-CM | POA: Insufficient documentation

## 2020-04-17 DIAGNOSIS — R55 Syncope and collapse: Secondary | ICD-10-CM | POA: Insufficient documentation

## 2020-04-17 LAB — ECHOCARDIOGRAM COMPLETE
Area-P 1/2: 2.56 cm2
P 1/2 time: 442 msec
S' Lateral: 2.7 cm

## 2020-06-22 NOTE — Progress Notes (Signed)
Cardiology Office Note:    Date:  06/25/2020   ID:  Jenna Caldwell, DOB 07/06/1955, MRN [id number]  PCP:  Patient, No Pcp Per (Inactive)  [facility] Cardiologist:  Meriam SpragueHeather E Jessica Checketts, MD  [facility] Electrophysiologist:  None   Referring MD: No ref. provider found    History of Present Illness:    Jenna Caldwell is a 65 y.o. female with a hx of HTN, pre-diabetes, and CKD who presents to clinic for follow-up.  [facility] with possible syncope after feeling lightheaded and dizzy in the shower. Prior to passing out, she felt nauseas and like she had a BM. In the ER, CT head negative for acute pathology. Carotid ultrasound without dissection or significant stenosis. Was referred to Cards for further management.  Last seen in clinic in 03/25/20. Patient stated she frequently felt nauseas/lightheaded in the shower especially when she would bend back to wash her hair. On several occasions, she had to get out of the shower and lay down to prevent her from losing consciousness. This is not unusual for her as she has had similar symptoms throughout her life. Specifically, she has almost fainted in the physician's office when reviewing a procedure or imaging test. This time was just different because she lost consciousness. After her ER visit, she was instructed to increase her salt intake a little bit and repeat labs showed improvement of her Na to 138.    Otherwise, she has no known personal history of heart disease. She does have a history of palpitations and holter performed in [place] was reassuringly normal. She was placed on metoprolol at that time with complete resolution.   TTE 04/17/20 with normal LVEF, G1DD, normal strain, no significant valve disease. Calcium score 0.  Today, the patient states that she overall feels well. No recurrent episodes of syncope. Occasional lightheadedness with rapid changes in position. No chest pain, palpitations, LE edema, or  orthopnea. Blood pressure is very well controlled. She remains active with no exertional symptoms.  Tolerating medications as prescribed.   Past Medical History:  Diagnosis Date  . Hypertension   . Renal disease     No past surgical history on file.  Current Medications: Current Meds  Medication Sig  . Calcium 200 MG TABS Take 2 tablets by mouth daily at 12 noon.  . Cholecalciferol (VITAMIN D3 PO) Take 1 tablet by mouth daily at 12 noon.  . Cyanocobalamin (VITAMIN B 12 PO) Take 1 tablet by mouth daily at 12 noon.  Marland Kitchen. imipramine (TOFRANIL) 50 MG tablet Take 1 tablet by mouth daily.  Marland Kitchen. lisinopril (ZESTRIL) 2.5 MG tablet Take 2.5 mg by mouth daily.  . metoprolol succinate (TOPROL-XL) 25 MG 24 hr tablet Take 1 tablet (25 mg total) by mouth daily.  . [DISCONTINUED] METOPROLOL SUCCINATE PO Take 25 mg by mouth daily.     Allergies:   Metformin and related and Sulfa antibiotics   Social History   Socioeconomic History  . Marital status: Married    Spouse name: Not on file  . Number of children: Not on file  . Years of education: Not on file  . Highest education level: Not on file  Occupational History  . Not on file  Tobacco Use  . Smoking status: Never Smoker  . Smokeless tobacco: Never Used  Vaping Use  . Vaping Use: Never used  Substance and Sexual Activity  . Alcohol use: Never  . Drug use: Never  . Sexual activity: Not  on file  Other Topics Concern  . Not on file  Social History Narrative  . Not on file   Social Determinants of Health   Financial Resource Strain: Not on file  Food Insecurity: Not on file  Transportation Needs: Not on file  Physical Activity: Not on file  Stress: Not on file  Social Connections: Not on file     Family History: Mother with history of DMII, MI at age 38; Grandmother with MI after delivery of her child at age 2, passed away from heart failure.   ROS:   Please see the history of present illness.    Review of Systems   Constitutional: Negative for chills, fever and malaise/fatigue.  HENT: Negative for nosebleeds.   Eyes: Negative for blurred vision.  Respiratory: Negative for cough and shortness of breath.   Cardiovascular: Negative for chest pain, palpitations, orthopnea, claudication, leg swelling and PND.  Gastrointestinal: Negative for blood in stool, nausea and vomiting.  Genitourinary: Negative for dysuria.  Musculoskeletal: Positive for joint pain.  Neurological: Negative for dizziness and loss of consciousness.  Endo/Heme/Allergies: Positive for environmental allergies.  Psychiatric/Behavioral: Negative for substance abuse.    EKGs/Labs/Other Studies Reviewed:    The following studies were reviewed today: TTE 29-Apr-2020: 1. Left ventricular ejection fraction, by estimation, is 60 to 65%. Left  ventricular ejection fraction by 3D volume is 58 %. The left ventricle has  normal function. The left ventricle has no regional wall motion  abnormalities. Left ventricular diastolic  parameters are consistent with Grade I diastolic dysfunction (impaired  relaxation). The average left ventricular global longitudinal strain is  -22.4 %. The global longitudinal strain is normal.  2. Right ventricular systolic function is normal. The right ventricular  size is normal. Tricuspid regurgitation signal is inadequate for assessing  PA pressure.  3. The mitral valve is normal in structure. No evidence of mitral valve  regurgitation. No evidence of mitral stenosis.  4. The aortic valve is normal in structure. Aortic valve regurgitation is  mild. Mild aortic valve sclerosis is present, with no evidence of aortic  valve stenosis.  5. The inferior vena cava is normal in size with greater than 50%  respiratory variability, suggesting right atrial pressure of 3 mmHg.   CT Calcium Score Apr 29, 2020: FINDINGS: Non-cardiac: See separate report from [facility].  Ascending aorta: Normal  size  Pericardium: Normal  Coronary arteries: Normal origin  IMPRESSION: Coronary calcium score of 0. This was 0 percentile for age and sex matched control.  Carotid Ultrasound 03/10/20: FINDINGS: Criteria: Quantification of carotid stenosis is based on velocity parameters that correlate the residual internal carotid diameter with NASCET-based stenosis levels, using the diameter of the distal internal carotid lumen as the denominator for stenosis measurement.  The following velocity measurements were obtained:  RIGHT ICA: 84/27 cm/sec CCA: 86/17 cm/sec  SYSTOLIC ICA/CCA RATIO:  1.0  ECA:  71 cm/sec  LEFT  ICA: 77/31 cm/sec  CCA: 100/26 cm/sec  SYSTOLIC ICA/CCA RATIO:  0.8  ECA:  84 cm/sec  RIGHT CAROTID ARTERY: No significant atherosclerotic plaque or evidence of stenosis in the internal carotid artery.  RIGHT VERTEBRAL ARTERY:  Patent with normal antegrade flow.  LEFT CAROTID ARTERY: No significant atherosclerotic plaque or stenosis in the internal carotid artery.  LEFT VERTEBRAL ARTERY:  Patent with normal antegrade flow.  IMPRESSION: Negative bilateral carotid duplex ultrasound.  EKG:  EKG 03/11/20: NSR with no ischemia or block  Recent Labs: 03/10/2020: ALT 10; BUN 21; Creatinine, Ser 1.73; Hemoglobin  12.3; Platelets 274; Potassium 4.3; Sodium 127  Recent Lipid Panel No results found for: CHOL, TRIG, HDL, CHOLHDL, VLDL, LDLCALC, LDLDIRECT2   Physical Exam:    VS:  BP 140/76   Pulse 83   Ht 5\' 4"  (1.626 m)   Wt 164 lb 12.8 oz (74.8 kg)   SpO2 99%   BMI 28.29 kg/m     Wt Readings from Last 3 Encounters:  06/25/20 164 lb 12.8 oz (74.8 kg)  03/25/20 160 lb 12.8 oz (72.9 kg)  03/10/20 162 lb (73.5 kg)     GEN:  Well developed, comfortable, NAD HEENT: Normal NECK: No JVD; No carotid bruits CARDIAC: RRR, no murmurs, rubs, gallops RESPIRATORY:  Clear to auscultation without rales, wheezing or rhonchi  ABDOMEN: Soft, non-tender,  non-distended MUSCULOSKELETAL:  No edema; No deformity. Warm. SKIN: Warm and dry NEUROLOGIC:  Alert and oriented x 3 PSYCHIATRIC:  Normal affect   ASSESSMENT:    1. Syncope, unspecified syncope type   2. Stage 3b chronic kidney disease (HCC)   3. Primary hypertension    PLAN:    In order of problems listed above:  #Vasovagal Syncope: Patients symptoms consistent with vasovagal syncope with the sensation of lightheadedness, nausea and generalized malaise while in a hot shower. Has history of similar symptoms with showering in the past as well as situational near syncope in physician's office. No chest pain or SOB. No known cardiac disease. Holter by primary in 03/12/20 normal and patient has no current palpitations. Likely symptoms exacerbated by dehydration/orthostasis with Na 127 in ED which improved when she increased her salt intake and hydration with gatorade. TTE normal with LVEF 60-65%, normal strain, G1DD, no significant valve disease. No recurrent symptoms. -TTE with normal LVEF, Ca score 0 -Continue fluid and salt intake--okay for her to have some salt in her diet despite CKD (told her 2g salt/day is reasonable for her; follows with renal nutritionist) -Continue compression stockings -Avoid prolonged, hot showers -If patient feels like she is going to faint, she needs to lay down and place her feet up  #CKD IIIB: Cr 1.73 at baseline. Patient is very diligent about monitoring her diet and electrolytes to ensure her numbers remain stable. Follows with Nephrology.  -Follow-up with Nephrology as scheduled -Okay for small amounts of Na in her diet as discussed on previous visit -Renally dose all medications  #HTN: Well controlled. Running 110-120s at home with no episodes of hypotension. -Continue lisinopril 2.5mg  daily -Continue metop 25mg  daily  #Risk Stratification: #Family History of CAD: CT calcium score 0. -Continue diet and lifestyle     Medication  Adjustments/Labs and Tests Ordered: Current medicines are reviewed at length with the patient today.  Concerns regarding medicines are outlined above.  No orders of the defined types were placed in this encounter.  Meds ordered this encounter  Medications  . metoprolol succinate (TOPROL-XL) 25 MG 24 hr tablet    Sig: Take 1 tablet (25 mg total) by mouth daily.    Dispense:  90 tablet    Refill:  3    Patient Instructions  Medication Instructions:   Your physician recommends that you continue on your current medications as directed. Please refer to the Current Medication list given to you today.  *If you need a refill on your cardiac medications before your next appointment, please call your pharmacy*    Follow-Up: At [facility], you and your health needs are our priority.  As part of our continuing mission to provide you  with exceptional heart care, we have created designated Provider Care Teams.  These Care Teams include your primary Cardiologist (physician) and Advanced Practice Providers (APPs -  Physician Assistants and Nurse Practitioners) who all work together to provide you with the care you need, when you need it.  We recommend signing up for the patient portal called "MyChart".  Sign up information is provided on this After Visit Summary.  MyChart is used to connect with patients for Virtual Visits (Telemedicine).  Patients are able to view lab/test results, encounter notes, upcoming appointments, etc.  Non-urgent messages can be sent to your provider as well.   To learn more about what you can do with MyChart, go to [email or web address].    Your next appointment:   1 year(s)  The format for your next appointment:   In Person  Provider:   Laurance Flatten, MD        Signed, Meriam Sprague, MD  06/25/2020 2:04 PM    [facility]

## 2020-06-25 ENCOUNTER — Encounter: Payer: Self-pay | Admitting: Cardiology

## 2020-06-25 ENCOUNTER — Other Ambulatory Visit: Payer: Self-pay

## 2020-06-25 ENCOUNTER — Ambulatory Visit (INDEPENDENT_AMBULATORY_CARE_PROVIDER_SITE_OTHER): Payer: BC Managed Care – PPO | Admitting: Cardiology

## 2020-06-25 VITALS — BP 140/76 | HR 83 | Ht 64.0 in | Wt 164.8 lb

## 2020-06-25 DIAGNOSIS — I10 Essential (primary) hypertension: Secondary | ICD-10-CM

## 2020-06-25 DIAGNOSIS — N1832 Chronic kidney disease, stage 3b: Secondary | ICD-10-CM

## 2020-06-25 DIAGNOSIS — R55 Syncope and collapse: Secondary | ICD-10-CM

## 2020-06-25 MED ORDER — METOPROLOL SUCCINATE ER 25 MG PO TB24: 25.0000 mg | ORAL_TABLET | Freq: Every day | ORAL | 3 refills | Status: DC

## 2020-06-25 NOTE — Patient Instructions (Signed)

## 2020-12-16 ENCOUNTER — Telehealth: Payer: BC Managed Care – PPO | Admitting: Physician Assistant

## 2020-12-16 DIAGNOSIS — R300 Dysuria: Secondary | ICD-10-CM

## 2020-12-16 MED ORDER — CEPHALEXIN 500 MG PO CAPS: 500.0000 mg | ORAL_CAPSULE | Freq: Two times a day (BID) | ORAL | 0 refills | Status: AC

## 2020-12-16 NOTE — Progress Notes (Signed)

## 2020-12-16 NOTE — Progress Notes (Signed)
I have spent 5 minutes in review of e-visit questionnaire, review and updating patient chart, medical decision making and response to patient.   Kevia Zaucha Cody Olon Russ, PA-C    

## 2021-01-04 ENCOUNTER — Telehealth: Payer: BC Managed Care – PPO | Admitting: Emergency Medicine

## 2021-01-04 DIAGNOSIS — N3000 Acute cystitis without hematuria: Secondary | ICD-10-CM

## 2021-01-04 MED ORDER — CIPROFLOXACIN HCL 500 MG PO TABS: 500.0000 mg | ORAL_TABLET | Freq: Two times a day (BID) | ORAL | 0 refills | Status: AC

## 2021-01-04 NOTE — Progress Notes (Signed)
E-Visit for Urinary Problems  We are sorry that you are not feeling well.  Here is how we plan to help!  Based on what you shared with me it looks like you most likely have a simple urinary tract infection.  A UTI (Urinary Tract Infection) is a bacterial infection of the bladder.  Most cases of urinary tract infections are simple to treat but a key part of your care is to encourage you to drink plenty of fluids and watch your symptoms carefully.  I have prescribed ciprofloxacin 500 mg twice daily for 7 days.  Your symptoms should gradually improve. Call us if the burning in your urine worsens, you develop worsening fever, back pain or pelvic pain or if your symptoms do not resolve after completing the antibiotic.  Urinary tract infections can be prevented by drinking plenty of water to keep your body hydrated.  Also be sure when you wipe, wipe from front to back and don't hold it in!  If possible, empty your bladder every 4 hours.  Sorry you are not feeling well! I am sending in a different antibiotic that will hopefully clear up this infection for you.  If you do not have improvement with this antibiotic over the next 12-24 hours, please seek in person evaluation with your PCP, at urgent care, or in the emergency room.  You may need to have your urine checked, and given stronger antibiotics at that time.    HOME CARE Drink plenty of fluids Compete the full course of the antibiotics even if the symptoms resolve Remember, when you need to go.go. Holding in your urine can increase the likelihood of getting a UTI! GET HELP RIGHT AWAY IF: You cannot urinate You get a high fever Worsening back pain occurs You see blood in your urine You feel sick to your stomach or throw up You feel like you are going to pass out  MAKE SURE YOU  Understand these instructions. Will watch your condition. Will get help right away if you are not doing well or get worse.   Thank you for choosing an  e-visit.  Your e-visit answers were reviewed by a board certified advanced clinical practitioner to complete your personal care plan. Depending upon the condition, your plan could have included both over the counter or prescription medications.  Please review your pharmacy choice. Make sure the pharmacy is open so you can pick up prescription now. If there is a problem, you may contact your provider through [employer] and have the prescription routed to another pharmacy.  Your safety is important to [place]. If you have drug allergies check your prescription carefully.   For the next 24 hours you can use MyChart to ask questions about today's visit, request a non-urgent call back, or ask for a work or school excuse. You will get an email in the next two days asking about your experience. I hope that your e-visit has been valuable and will speed your recovery.

## 2021-01-04 NOTE — Progress Notes (Signed)
I have spent 5 minutes in review of e-visit questionnaire, review and updating patient chart, medical decision making and response to patient.   Kellan Boehlke, PA-C    

## 2021-06-23 ENCOUNTER — Telehealth: Payer: BC Managed Care – PPO | Admitting: Family Medicine

## 2021-06-23 DIAGNOSIS — R300 Dysuria: Secondary | ICD-10-CM

## 2021-06-23 MED ORDER — CEPHALEXIN 500 MG PO CAPS: 500.0000 mg | ORAL_CAPSULE | Freq: Two times a day (BID) | ORAL | 0 refills | Status: AC

## 2021-06-23 NOTE — Progress Notes (Signed)

## 2021-08-19 LAB — HM DEXA SCAN

## 2021-09-09 LAB — HM MAMMOGRAPHY

## 2021-09-30 IMAGING — MR MR HEAD W/O CM
12 of 13 series · 44 of 48 positions shown · non-contrast
Comparison: 12/16/2019 head CT.

CLINICAL DATA: Neuro deficit, acute, stroke suspected.

EXAM:
MRI HEAD WITHOUT CONTRAST
TECHNIQUE: Multiplanar, multiecho pulse sequences of the brain and surrounding
structures were obtained without intravenous contrast.

[Series 5: DWI · axial · 3.0mm · 0.88mm/px · z∈[-101,+44]mm · 8 of 100 slices shown (1 of 4)]
[im 1/100]
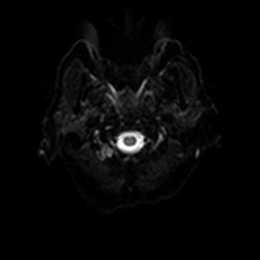
[im 15/100]
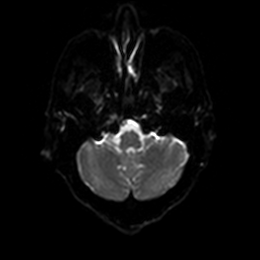
[im 29/100]
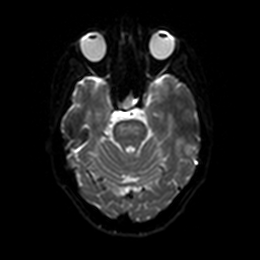
[im 43/100]
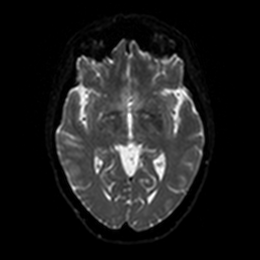
[im 57/100]
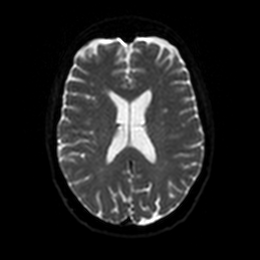
[im 71/100]
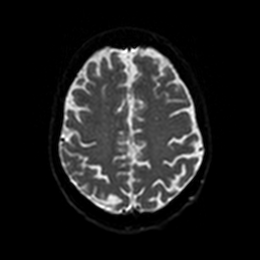
[im 85/100]
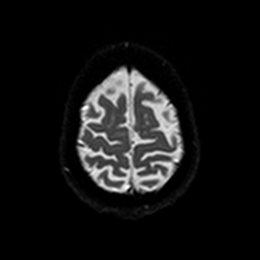
[im 100/100]
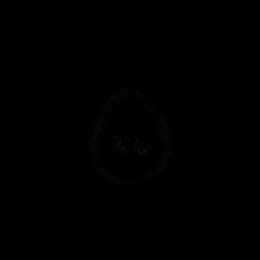

[Series 6: DWI · axial · 3.0mm · 0.88mm/px · z∈[-101,+44]mm · 4 of 50 slices shown (2 of 4)]
[im 1/50]
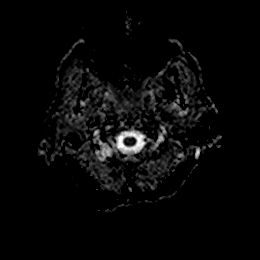
[im 17/50]
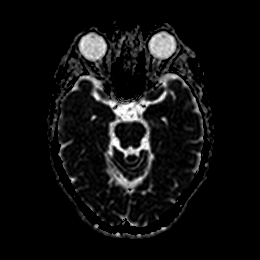
[im 33/50]
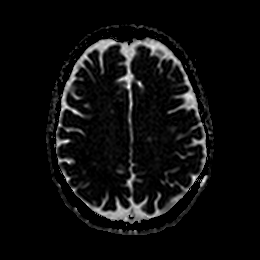
[im 50/50]
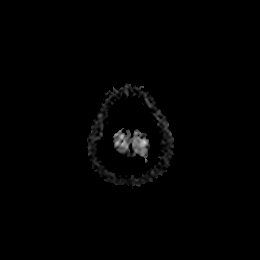

[Series 7: DWI · coronal · 4.0mm · 0.88mm/px · 5 of 70 slices shown (3 of 4)]
[im 1/70]
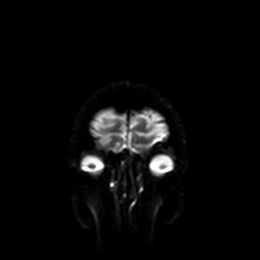
[im 18/70]
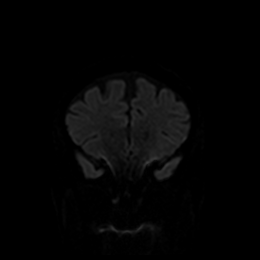
[im 35/70]
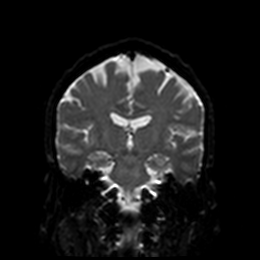
[im 52/70]
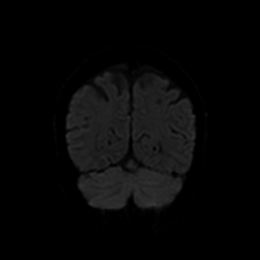
[im 70/70]
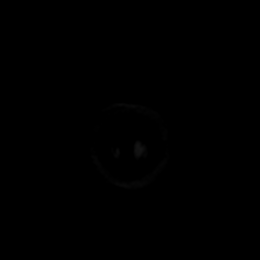

[Series 8: DWI · coronal · 4.0mm · 0.88mm/px · 3 of 35 slices shown (4 of 4)]
[im 1/35]
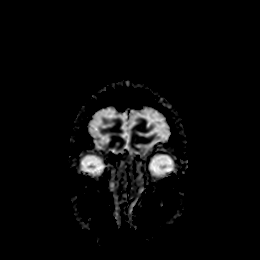
[im 18/35]
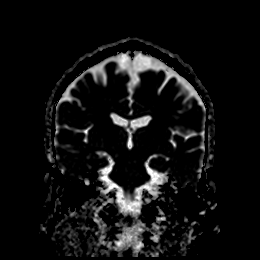
[im 35/35]
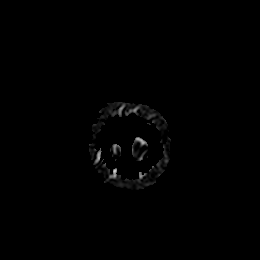

[Series 9: T1 · sagittal · 5.0mm · 0.75mm/px · 2 of 25 slices shown]
[im 1/25]
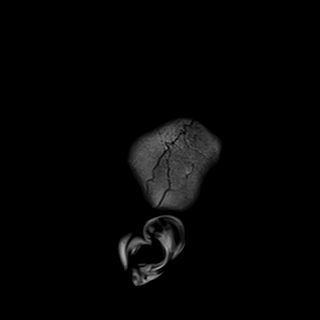
[im 25/25]
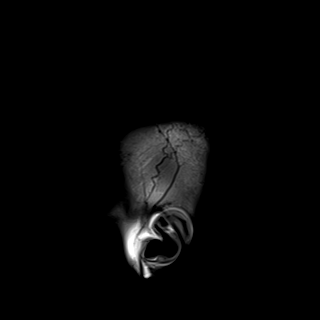

[Series 10: T2 · axial · 5.0mm · 0.72mm/px · z∈[-105,+48]mm · 2 of 27 slices shown (1 of 2)]
[im 1/27]
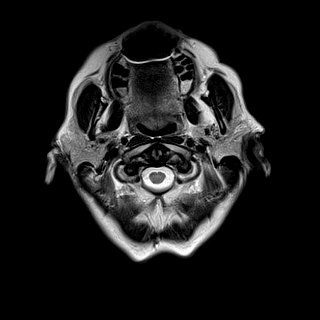
[im 27/27]
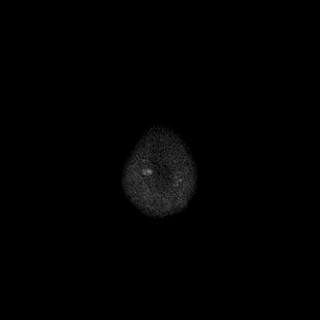

[Series 11: FLAIR · axial · 5.0mm · 0.45mm/px · z∈[-106,+48]mm · 2 of 27 slices shown]
[im 1/27]
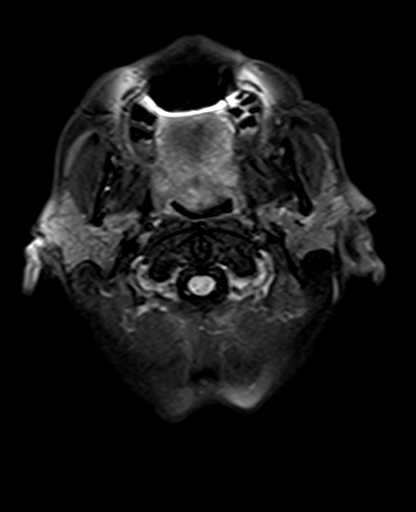
[im 27/27]
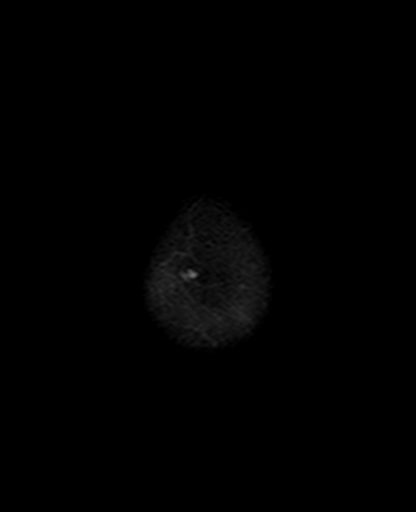

[Series 12: mag_images · axial · 3.0mm · 0.90mm/px · z∈[-116,+46]mm · 4 of 56 slices shown]
[im 1/56]
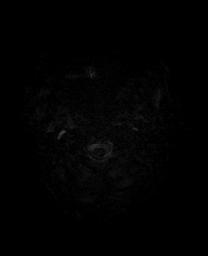
[im 19/56]
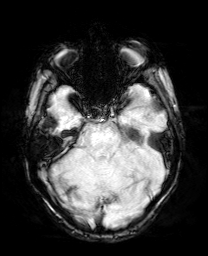
[im 37/56]
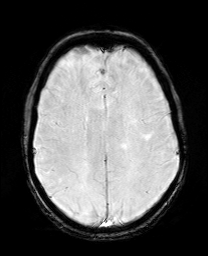
[im 56/56]
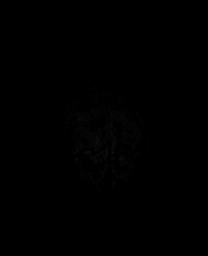

[Series 13: pha_images · axial · 3.0mm · 0.90mm/px · z∈[-116,+46]mm · 4 of 56 slices shown]
[im 1/56]
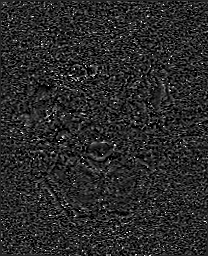
[im 19/56]
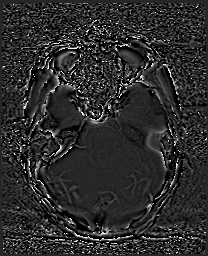
[im 37/56]
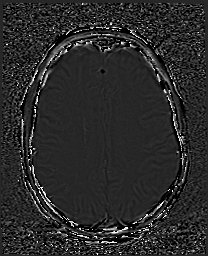
[im 56/56]
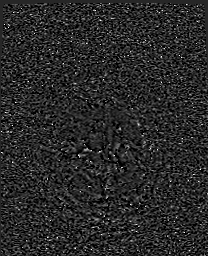

[Series 14: swi_images · axial · 3.0mm · 0.90mm/px · z∈[-116,+46]mm · 4 of 56 slices shown]
[im 1/56]
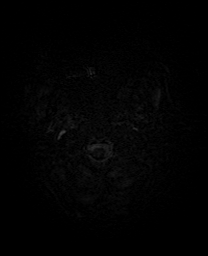
[im 19/56]
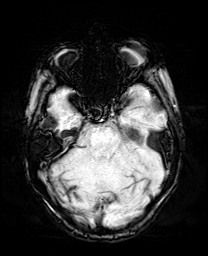
[im 37/56]
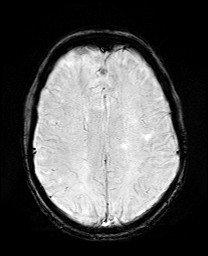
[im 56/56]
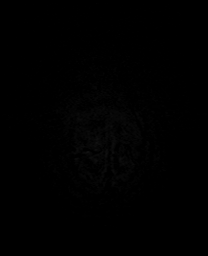

[Series 15: mip_images(sw) · axial · 24.0mm · 0.90mm/px · z∈[-106,+36]mm · 4 of 49 slices shown]
[im 1/49]
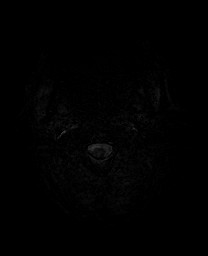
[im 17/49]
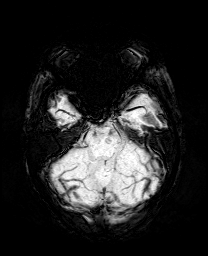
[im 33/49]
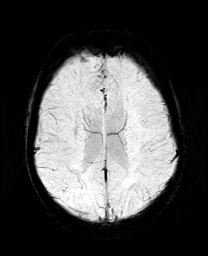
[im 49/49]
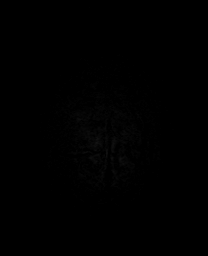

[Series 17: T2 · coronal · 5.0mm · 0.34mm/px · 2 of 29 slices shown (2 of 2)]
[im 1/29]
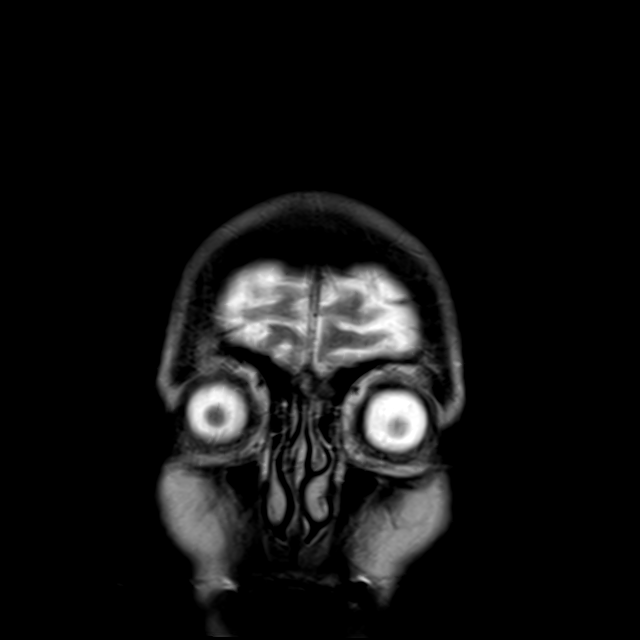
[im 29/29]
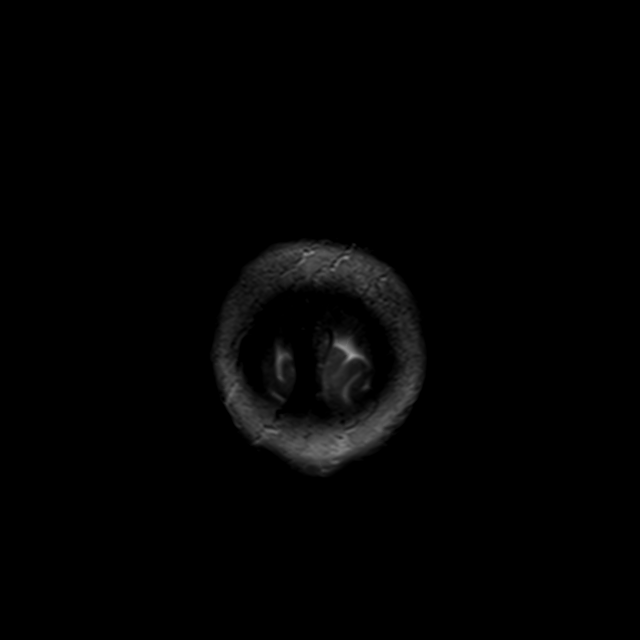

[44 of 48 positions shown; findings below may reference images not displayed]

FINDINGS: Brain: No diffusion-weighted signal abnormality. No intracranial
hemorrhage. No midline shift, ventriculomegaly or extra-axial fluid
collection. No mass lesion. Mild chronic microvascular ischemic
changes. Sequela of remote left frontal insult.

Vascular: Normal flow voids.

Skull and upper cervical spine: Normal marrow signal.

Sinuses/Orbits: Normal orbits. Clear paranasal sinuses. Trace
bilateral mastoid effusion.

Other: None.
IMPRESSION: No acute intracranial process.  Remote left frontal insult.

Mild chronic microvascular ischemic changes.

## 2022-06-22 NOTE — Progress Notes (Signed)
 Nephrology Progress Note   HPI Jenna Caldwell is a 67 y.o. female who presents to nephrology clinic for follow up of chronic kidney disease stage III with a history of atrophic right kidney without evidence of renal artery stenosis. Past medical history significant for hypertension.  Jenna Caldwell was last seen in the clinic by myself in July 2023. At that time, she completed a 24-hour urine collection which revealed a corrected creatinine clearance of 26.29 with an uncorrected of 28.42.  Her 24-hour urine protein level was below instrument linearity.  Renal ultrasound continue to show right renal atrophy with cortical thinning.  Her left kidney appeared normal without evidence of mass, cyst, hydronephrosis or cortical thinning.  She had a low likelihood of significant renal artery stenosis and her bladder appeared normal with a negative postvoid residual volume.  Blood pressures were well-controlled in the home setting and in the office.  She was curious about whether or not she could stop lisinopril.  As she had stable absent proteinuria I told her she could stop the medication if she wanted.  Her kidney function at that time revealed a creatinine of 1.54 with an EGFR of 37.   Jenna Caldwell states she is feeling well overall.  She is concerned today about her labs.  She tells me that she had been following very closely with a nutritionist in the past and had a very structured and limiting diet.  She would keep track of macros and her intake.  She did really quite well with this and was able to maintain healthy weight.  She tells me over the past year or so she is really come off of this diet.  She has gained about 15 pounds.  She feels bad that this has happened and questions whether her dietary indiscretions have hurt her kidney function.  She does continue to take her blood pressures occasionally at home and tells me these are usually 130s to 140s systolically.  ROS General: She reports weight gain.  She tells me that  her appetite has been decreased.  She typically eats about 1 meal per day.  She has been eating out quite a bit more and not as stringent with her dietary intake.  Sleep has been variable.  She denies any recent fever, chills, sweats. Skin: She denies skin rash, lesions, open wounds. Eye: He denies vision change, eye pain, redness. ENT: She denies rhinorrhea, nasal congestion, sore throat, earache. CV: She had been having some chest heaviness.  She reported this and her primary care provider set her up for a stress test.  The first portion of this was normal.  She tells me that she will be having another test towards the end of the month.  She denies exertional shortness of breath, lower extremity edema. Resp: She denies any cough, sputum production, dyspnea, orthopnea. Abd: She has noticed a lot more diarrhea lately as she has been eating a lot more greasy or oily foods.  She denies any abdominal pain, nausea, vomiting, symptoms of GI bleeding. GU: She still has nocturia once or twice per night.  She at times feels like her bladder does not empty completely.  She does have some urine leakage when she sneezes or coughs.  She denies dysuria, hematuria, urgency, frequency. Extr: She tells me that she had a swollen very painful joint to her left middle finger a while back.  She was worried that this was gout as the pain was similar to what she had previously experienced.  She was  put on a 5-day course of prednisone  and the pain and the changes to her joint resolved.  She denies any other acute joint pain, swelling, stiffness. Neuro: Denies lightheadedness, dizziness, headache, seizure, falls. Psyc: She admits to a variable mood.  She tells me that she always has anxiety but she does feel like she has some depression here lately.  She is currently on Zoloft .    Allergies  Allergen Reactions  . Glucophage [Metformin] Swelling  . Sulfamide Hives and Rash    Sulfa Drugs    Current Outpatient  Medications:  .  metoprolol  succinate XL (TOPROL -XL) 25 mg 24 hr tablet, Take 0.5 tablets (12.5 mg total) by mouth daily, Disp: 45 tablet, Rfl: 3 .  sertraline  (Zoloft ) 25 mg tablet, Take 1 tablet (25 mg total) by mouth daily, Disp: , Rfl:  .  calcium carbonate (CALCIUM 600 ORAL), Take 600 mg by mouth daily, Disp: , Rfl:  .  cholecalciferol, vitamin D3, 25 mcg (1,000 unit) capsule, Take 1 capsule (1,000 Units total) by mouth daily, Disp: , Rfl:  .  cyanocobalamin 1,000 mcg tablet, Take 1 tablet (1,000 mcg total) by mouth daily, Disp: , Rfl:   Patient Active Problem List  Diagnosis  . CKD (chronic kidney disease) stage 3, GFR 30-59 ml/min (CMS/HCC)  . Essential hypertension  . Hyperuricemia  . Secondary hyperparathyroidism (CMS/HCC)  . Depression with anxiety  . Osteopenia    Labs   Recent Results (from the past 336 hour(s))  Creatinine, urine, random Urine, Clean Catch   Collection Time: 06/22/22  9:53 AM  Result Value Ref Range   Creatinine, Ur 33.0 MG/DL  Hemoglobin Blood, Venous   Collection Time: 06/22/22  9:53 AM  Result Value Ref Range   Hemoglobin 13.7 11.5 - 15.5 g/dL  Magnesium Blood, Venous   Collection Time: 06/22/22  9:53 AM  Result Value Ref Range   Magnesium 1.9 1.8 - 2.4 MG/DL  PTH, intact Blood, Venous   Collection Time: 06/22/22  9:53 AM  Result Value Ref Range   PTH 77.1 (H) 9.0 - 59.0 pg/mL  Renal function panel Blood, Venous   Collection Time: 06/22/22  9:53 AM  Result Value Ref Range   Albumin 4.1 3.5 - 5.3 g/dL   Calcium 9.2 8.6 - 89.6 mg/dL   Phosphorus 4.0 2.5 - 4.9 mg/dL   Glucose 891 (H) 70 - 105 mg/dL   BUN 23 7 - 25 mg/dL   Creatinine 8.22 (H) 9.39 - 1.10 mg/dL   Sodium 863 864 - 854 mmol/L   Potassium 4.1 3.5 - 5.1 MMOL/L   Chloride 101 98 - 107 mmol/L   CO2 25 21 - 32 mmol/L   Anion Gap 10 3 - 11 mmol/L   Corrected Calcium 9.1 8.6 - 10.3 mg/dL   eGFR 31 (L) >39 fO/fpw/8.26f*7  Uric acid Blood, Venous   Collection Time: 06/22/22  9:53  AM  Result Value Ref Range   Uric Acid 8.0 (H) 2.3 - 6.6 mg/dL  Urine Random Total Protein Urine, Clean Catch   Collection Time: 06/22/22  9:53 AM  Result Value Ref Range   Protein, Ur <4.0 0.0 - 21.4 mg/dL  Urinalysis, Dip (part 1 of panel) Urine, Clean Catch   Collection Time: 06/22/22  9:53 AM   Specimen: Urine, Clean Catch  Result Value Ref Range   Color, Urine Yellow Yellow   Clarity, Urine Clear Clear   pH, Urine 6.0 5.0 - 8.0 PH   Specific Gravity, Urine <=1.005 1.003 -  1.030   Protein, Urine Negative Negative MG/DL   Glucose, Urine Negative Negative MG/DL   Ketones, Urine Negative Negative MG/DL   Blood, Urine Negative Negative   Nitrite, Urine Negative Negative   Bilirubin, Urine Negative Negative   Leukocytes, Urine Trace (A) Negative   Urobilinogen, Urine 0.2 <2.0 mg/dL  Urinalysis, Micro (part 2 of panel) Urine, Clean Catch   Collection Time: 06/22/22  9:53 AM   Specimen: Urine, Clean Catch  Result Value Ref Range   RBC, Urine 0-2 None seen, 0-2, Negative /HPF   WBC, Urine 5-9 (A) 0 - 4 /HPF   Squamous Epithelial, Urine 0-4 None Seen-9 /HPF   Bacteria, Urine Few None seen, Few /HPF  Urine culture   Collection Time: 06/22/22  9:53 AM   Specimen: Urine, Clean Catch  Result Value Ref Range   Urine Culture      Pending Susceptibility and/or Culture Identification   Urine Culture >100,000 CFU/mL Escherichia coli (A)      Vital Signs Vitals:   06/24/22 1251 06/24/22 1254  BP: 144/61   Pulse: 94   SpO2:  97%  Height: 1.626 m (5' 4.02)   Weight: 81.6 kg (180 lb)   PainSc: 0-No pain   Patient Position: Sitting     Physical Exam General: Very pleasant 67 year old white female in no acute distress. Skin: No skin rash, lesions, open wounds to areas examined. Eye: PERRLA, no scleral icterus, no drainage or discharge. ENT: Oral mucosa is pink and moist and without lesions.  Neck is supple, no cervical or supraclavicular lymphadenopathy is  noted. Cardiovascular: S1-S2 without murmur or arrhythmia.  No carotid bruits are noted. Respiratory: Clear to auscultation bilaterally. Abdominal: Soft, nontender, normal bowel sounds. Musculoskeletal: No peripheral edema is noted.  Peripheral pulses are palpable.  No obvious joint deformity or swelling is noted to areas examined. Neurological: Alert and oriented.  She is an excellent historian. Psych: Mood and affect are appropriate for office visit.   Assessment and Plan 1. Stage 3b chronic kidney disease (CMS/HCC)      2. Essential hypertension      3. Hyperuricemia         Chronic kidney disease, stage 3B: Kidney function remained stable with a creatinine of 1.77 and an EGFR of 31.  Overall her labs are pretty stable.  I did review all of her recent labs with her today and have answered multiple questions from the patient.  I have encouraged her to go back to following a healthier diet as she did seem to feel better when she was on this and this will help with weight loss too.  We also talked about adding in some frequent exercise to help with weight loss as well.  As she has been having some problems with one of her knees, I recommended that she try water aerobics or walking in the channel at the swim center.  I have also encouraged her to extend her self a little more grace regarding her diet.  At this time we will plan for follow-up in the office in 6 months with repeat labs.  Jenna Caldwell agrees to the plan of care and denies any further questions or concerns. Electrolytes: Stable. Acid base: Stable. Volume status/hypertension: Her blood pressure is mildly elevated today at 144/61.  I have asked her to check her blood pressures at home.  We reviewed our goal blood pressure of 140/90 or less.  I have asked her to call if her blood pressures are greater than  goal consistently and we will adjust medications. Mineral bone disease: Calcium and phosphorus are normal at 9.2 and 4.0 respectively.   Her PTH is mildly elevated but we will watch for now.  She remains on cholecalciferol 2000 units by mouth daily. Proteinuria: Stable absent proteinuria. Hyperuricemia: Uric acid level is mildly elevated at 8.0.  For now we will continue to monitor.  We did discuss a low purine diet.  COMORBID CONDITIONS:  A.  Right renal atrophy: We reviewed her prior renal ultrasound which showed significant atrophy of her right kidney.  I did explain explained to her that this is likely the cause of her chronic kidney disease but we also need to protect remaining renal function by keeping blood blood pressures under good control.  Return in about 6 months (around 12/25/2022). Repeat labs including: Renal panel, magnesium, uric acid, hemoglobin, PTH, UA, urine random protein and creatinine.   Nat Louder, CNP Nephrology

## 2022-12-10 ENCOUNTER — Encounter: Payer: Self-pay | Admitting: Family Medicine

## 2022-12-10 ENCOUNTER — Ambulatory Visit (INDEPENDENT_AMBULATORY_CARE_PROVIDER_SITE_OTHER): Payer: Medicare Other | Admitting: Family Medicine

## 2022-12-10 VITALS — BP 140/72 | HR 75 | Temp 98.3°F | Ht 63.5 in | Wt 184.0 lb

## 2022-12-10 DIAGNOSIS — F419 Anxiety disorder, unspecified: Secondary | ICD-10-CM | POA: Insufficient documentation

## 2022-12-10 DIAGNOSIS — F32A Depression, unspecified: Secondary | ICD-10-CM | POA: Insufficient documentation

## 2022-12-10 DIAGNOSIS — I10 Essential (primary) hypertension: Secondary | ICD-10-CM | POA: Diagnosis not present

## 2022-12-10 DIAGNOSIS — Z7689 Persons encountering health services in other specified circumstances: Secondary | ICD-10-CM

## 2022-12-10 DIAGNOSIS — Z1211 Encounter for screening for malignant neoplasm of colon: Secondary | ICD-10-CM

## 2022-12-10 DIAGNOSIS — Z1231 Encounter for screening mammogram for malignant neoplasm of breast: Secondary | ICD-10-CM

## 2022-12-10 DIAGNOSIS — N1832 Chronic kidney disease, stage 3b: Secondary | ICD-10-CM

## 2022-12-10 MED ORDER — METOPROLOL SUCCINATE ER 25 MG PO TB24: 12.5000 mg | ORAL_TABLET | Freq: Every day | ORAL | 0 refills | Status: DC

## 2022-12-10 MED ORDER — SERTRALINE HCL 50 MG PO TABS: 25.0000 mg | ORAL_TABLET | Freq: Every day | ORAL | 1 refills | Status: DC

## 2022-12-10 NOTE — Progress Notes (Signed)
New Patient Office Visit  Subjective    Patient ID: Jenna Caldwell, female    DOB: 1955-08-12  Age: 67 y.o. MRN: [id number]  CC:  Chief Complaint  Patient presents with   Establish Care    Has CKD 3, would like referral.     HPI Jenna Caldwell presents to establish care with new provider.   Patients previous primary care provider [facility] in [place], [place], with Otho Najjar, CNP. Last visit was 05/31/2022.  Specialist: [facility] in [place], [place] with Holland Falling, CNP  HTN: Chronic. Patient is taking Metoprolol 12.5mg  daily. Patient reports she usually monitors her blood pressure at home, but she not measured recently due to moving to the area.  Denies CP, SHOB, lightheadedness, dizziness, or lower extremity edema. Reports headaches, but thinks it is change in climate.   Depression/Anxiety: Chronic. Patient is taking Sertraline 25mg  daily. Effective. Denies SI or HI.  Outpatient Encounter Medications as of 12/10/2022  Medication Sig   Calcium 200 MG TABS Take 2 tablets by mouth daily at 12 noon.   Cholecalciferol (VITAMIN D3 PO) Take 1 tablet by mouth daily at 12 noon.   Cyanocobalamin (VITAMIN B 12 PO) Take 1 tablet by mouth daily at 12 noon.   [DISCONTINUED] metoprolol succinate (TOPROL-XL) 25 MG 24 hr tablet Take 1 tablet (25 mg total) by mouth daily. (Patient taking differently: Take 12.5 mg by mouth daily.)   [DISCONTINUED] sertraline (ZOLOFT) 50 MG tablet Take 25 mg by mouth daily.   metoprolol succinate (TOPROL-XL) 25 MG 24 hr tablet Take 0.5 tablets (12.5 mg total) by mouth daily.   sertraline (ZOLOFT) 50 MG tablet Take 0.5 tablets (25 mg total) by mouth daily.   [DISCONTINUED] imipramine (TOFRANIL) 50 MG tablet Take 1 tablet by mouth daily.   [DISCONTINUED] lisinopril (ZESTRIL) 2.5 MG tablet Take 2.5 mg by mouth daily.   No facility-administered encounter medications on file as of 12/10/2022.    Past Medical History:  Diagnosis  Date   Allergy    Sulfa and metformin severe reaction   Anxiety    Arthritis    Depression    Diabetes mellitus without complication (HCC) 2003   Pre diabetes   Hypertension    Osteopenia    Renal disease    Stage 3B CKD    Past Surgical History:  Procedure Laterality Date   ABLATION     Endometrial   APPENDECTOMY  1967   In sixth gradw   DILATION AND CURETTAGE OF UTERUS     KNEE SURGERY Left    Menisus   MOUTH SURGERY     08/2011   TONSILLECTOMY      Family History  Problem Relation Age of Onset   Kidney disease Mother    Arthritis Mother    Depression Mother    Diabetes Mother    Heart disease Mother    Hypertension Mother    Heart attack Father    Emphysema Father    Diabetes Sister    Anxiety disorder Sister    Diabetes Sister    Heart attack Maternal Grandmother    Stroke Paternal Grandfather     Social History   Socioeconomic History   Marital status: Married    Spouse name: Not on file   Number of children: 3   Years of education: Not on file   Highest education level: Doctorate  Occupational History   Not on file  Tobacco Use   Smoking status: Never  Smokeless tobacco: Never  Vaping Use   Vaping status: Never Used  Substance and Sexual Activity   Alcohol use: Yes    Comment: Twice a month- 1 glass of wine   Drug use: Never   Sexual activity: Yes    Birth control/protection: Post-menopausal  Other Topics Concern   Not on file  Social History Narrative   Not on file   Social Determinants of Health   Financial Resource Strain: Low Risk  (12/07/2022)   Overall Financial Resource Strain (CARDIA)    Difficulty of Paying Living Expenses: Not hard at all  Food Insecurity: No Food Insecurity (12/07/2022)   Hunger Vital Sign    Worried About Running Out of Food in the Last Year: Never true    Ran Out of Food in the Last Year: Never true  Transportation Needs: No Transportation Needs (12/07/2022)   PRAPARE - Scientist, research (physical sciences) (Medical): No    Lack of Transportation (Non-Medical): No  Physical Activity: Insufficiently Active (12/07/2022)   Exercise Vital Sign    Days of Exercise per Week: 4 days    Minutes of Exercise per Session: 30 min  Stress: Stress Concern Present (12/07/2022)   [employer] of Occupational Health - Occupational Stress Questionnaire    Feeling of Stress : Very much  Social Connections: Socially Integrated (12/07/2022)   Social Connection and Isolation Panel [NHANES]    Frequency of Communication with Friends and Family: Once a week    Frequency of Social Gatherings with Friends and Family: More than three times a week    Attends Religious Services: More than 4 times per year    Active Member of [employer] or Organizations: Yes    Attends Banker Meetings: Patient declined    Marital Status: Married  Catering manager Violence: Not At Risk (12/10/2022)   Humiliation, Afraid, Rape, and Kick questionnaire    Fear of Current or Ex-Partner: No    Emotionally Abused: No    Physically Abused: No    Sexually Abused: No    ROS See HPI above    Objective   BP (!) 140/72 (BP Location: Left Arm, Patient Position: Sitting, Cuff Size: Normal)   Pulse 75   Temp 98.3 F (36.8 C) (Oral)   Ht 5' 3.5" (1.613 m)   Wt 184 lb (83.5 kg)   SpO2 96%   BMI 32.08 kg/m   Physical Exam Vitals reviewed.  Constitutional:      General: She is not in acute distress.    Appearance: Normal appearance. She is obese. She is not ill-appearing, toxic-appearing or diaphoretic.  HENT:     Head: Normocephalic and atraumatic.  Eyes:     General:        Right eye: No discharge.        Left eye: No discharge.     Conjunctiva/sclera: Conjunctivae normal.  Cardiovascular:     Rate and Rhythm: Normal rate and regular rhythm.     Heart sounds: Normal heart sounds. No murmur heard.    No friction rub. No gallop.  Pulmonary:     Effort: Pulmonary effort is normal. No respiratory  distress.     Breath sounds: Normal breath sounds.  Musculoskeletal:        General: Normal range of motion.  Skin:    General: Skin is warm and dry.  Neurological:     General: No focal deficit present.     Mental Status: She is alert and oriented  to person, place, and time. Mental status is at baseline.  Psychiatric:        Mood and Affect: Mood normal.        Behavior: Behavior normal.        Thought Content: Thought content normal.        Judgment: Judgment normal.      Assessment & Plan:  Primary hypertension Assessment & Plan: Blood pressure is elevated in office. Recommend to continue Metoprolol 12.5mg  daily. Refilled medication. Recommend to monitor BP BID for 2 weeks and follow up. Patient reports her blood pressure usually is elevated more at a doctors visit. Will change dose if BP continues to be elevated at home.   Orders: -     Metoprolol Succinate ER; Take 0.5 tablets (12.5 mg total) by mouth daily.  Dispense: 45 tablet; Refill: 0  Anxiety and depression Assessment & Plan: Stable. Continue Sertraline 25mg  daily. Refilled medication. Patient scored 8 on PHQ-9 depression screening and 12 on GAD-7 anxiety screening. Patient reports her anxiety is a little more with moving back to the area and making a lot of decisions.   Orders: -     Sertraline HCl; Take 0.5 tablets (25 mg total) by mouth daily.  Dispense: 45 tablet; Refill: 1  Chronic kidney disease, stage 3b (HCC) -     Ambulatory referral to Nephrology  Colon cancer screening -     Ambulatory referral to Gastroenterology  Encounter for screening mammogram for malignant neoplasm of breast -     3D Screening Mammogram, Left and Right; Future  Encounter to establish care   1.Review health maintenance:  -Colonoscopy-2014; Placed a referral to GI  -Covid booster: [facility] at [address] and [address] weeks ago -Bone Density: Within the last 2 years with PCP; will try to look up in chart  -Tdap vaccine: Unsure;  Recommend to obtain at your local pharmacy  -Hep C screening: Will order when labs need to be completed.  -Influenza vaccine: [facility] at [address] and [address]- 2 weeks ago -Mammogram: Ordered  -Medicare Annual [employer] telehealth with health coach -Zoster vaccine: Patient will upload her vaccine. She had it outside of [place].  2.Placed a referral to nephrology to establish care since she is moving back to the area and previously was under the care of nephrology for her CKD.  Marland Kitchen3.Advised to please call the office or send a MyChart message if she does not receive a phone call or MyChart message about appointments from referral or images ordered.  Return in about 2 weeks (around 12/24/2022) for AWV-health coach, televisit , chronic management.   Zandra Abts, NP

## 2022-12-10 NOTE — Patient Instructions (Addendum)
-  It was a pleasure to meet you and look forward to taking care of you.  -Placed a referral to GI for colonoscopy and nephrology. Please call the office or send a MyChart message if you do not receive a phone call or MyChart message about appointments. -Placed a order for mammogram. Please call the office or send a MyChart message if you do not receive a phone call or MyChart message about appointment. -Refill Metoprolol and Sertraline.  -Blood pressure is elevated today, recommend to monitor your blood pressure twice a day and bring your readings in 2 weeks for a follow up.

## 2022-12-10 NOTE — Assessment & Plan Note (Signed)
Stable. Continue Sertraline 25mg  daily. Refilled medication. Patient scored 8 on PHQ-9 depression screening and 12 on GAD-7 anxiety screening. Patient reports her anxiety is a little more with moving back to the area and making a lot of decisions.

## 2022-12-10 NOTE — Assessment & Plan Note (Signed)
Blood pressure is elevated in office. Recommend to continue Metoprolol 12.5mg  daily. Refilled medication. Recommend to monitor BP BID for 2 weeks and follow up. Patient reports her blood pressure usually is elevated more at a doctors visit. Will change dose if BP continues to be elevated at home.

## 2022-12-24 ENCOUNTER — Ambulatory Visit (INDEPENDENT_AMBULATORY_CARE_PROVIDER_SITE_OTHER): Payer: Medicare Other | Admitting: Family Medicine

## 2022-12-24 ENCOUNTER — Encounter: Payer: Self-pay | Admitting: Family Medicine

## 2022-12-24 VITALS — BP 148/82 | HR 94 | Temp 98.2°F | Ht 63.5 in | Wt 182.4 lb

## 2022-12-24 DIAGNOSIS — N1832 Chronic kidney disease, stage 3b: Secondary | ICD-10-CM

## 2022-12-24 DIAGNOSIS — I10 Essential (primary) hypertension: Secondary | ICD-10-CM | POA: Diagnosis not present

## 2022-12-24 LAB — COMPREHENSIVE METABOLIC PANEL
ALT: 13 U/L (ref 0–35)
AST: 15 U/L (ref 0–37)
Albumin: 4.4 g/dL (ref 3.5–5.2)
Alkaline Phosphatase: 80 U/L (ref 39–117)
BUN: 26 mg/dL — ABNORMAL HIGH (ref 6–23)
CO2: 28 meq/L (ref 19–32)
Calcium: 9.6 mg/dL (ref 8.4–10.5)
Chloride: 103 meq/L (ref 96–112)
Creatinine, Ser: 1.87 mg/dL — ABNORMAL HIGH (ref 0.40–1.20)
GFR: 27.14 mL/min — ABNORMAL LOW (ref >60.00)
Glucose, Bld: 113 mg/dL — ABNORMAL HIGH (ref 70–99)
Potassium: 4.8 meq/L (ref 3.5–5.1)
Sodium: 139 meq/L (ref 135–145)
Total Bilirubin: 0.5 mg/dL (ref 0.2–1.2)
Total Protein: 7.6 g/dL (ref 6.0–8.3)

## 2022-12-24 MED ORDER — METOPROLOL SUCCINATE ER 25 MG PO TB24: 25.0000 mg | ORAL_TABLET | Freq: Every day | ORAL | Status: DC

## 2022-12-24 NOTE — Patient Instructions (Addendum)
-  Recommend to increase Metoprolol to 25mg  from 12.5mg  daily. Suspect blood pressure is actually a little higher than her readings suggest. Recommend to monitor your blood pressure while out of town. If you get the opportunity, please upload readings and will provide feedback.  -Ordered CMP to assess kidney function. Office will call with lab results and you will see them on MyChart.  -Recommend to please reach back out GI to make arrangements to make an appointment after you return from [place].  -Please follow up as soon as you return from [place].  -Congratulations on your new grand baby on the way!

## 2022-12-24 NOTE — Assessment & Plan Note (Addendum)
Recommend to increase Metoprolol to 25mg  from 12.5mg  daily. Suspect blood pressure is actually a little higher than her readings suggest. Based on readings with cuff and manual cuff today in office, the readings were off. She is going to try to start taking her blood pressure with her regular blood pressure cuff that she has in her RV. Recommend to monitor your blood pressure while out of town. She reports she is leaving Tuesday for [place] and will not return until February or later. Advised if she got the opportunity, please upload readings and will provide feedback. Ordered CMP to assess kidney function before leaving.

## 2022-12-24 NOTE — Progress Notes (Signed)
Established Patient Office Visit   Subjective:  Patient ID: Jenna Caldwell, female    DOB: Mar 14, 1955  Age: 67 y.o. MRN: [id number]  Chief Complaint  Patient presents with   Medical Management of Chronic Issues    HPI HTN: Chronic. On previous visit, patients blood pressure was elevated, but had reported it usually is elevated at doctors visit. Recommended to continue Metoprolol 12.5mg  and to monitor her blood pressure BID for 2 weeks. BP: Patient has been monitoring her blood pressure. Ranging blood pressures 114-141/47-67. Concerned about the accuracy of her blood pressure with those readings and a reading with her blood pressure cuff based on a manual reading. She denies any CP, SHOB, HA, dizziness, lightheadedness, or lower extremity edema.  ROS See HPI above     Objective:   BP (!) 148/82 (BP Location: Left Arm, Patient Position: Sitting, Cuff Size: Normal)   Pulse 94   Temp 98.2 F (36.8 C) (Oral)   Ht 5' 3.5" (1.613 m)   Wt 182 lb 6 oz (82.7 kg)   SpO2 98%   BMI 31.80 kg/m    Physical Exam Vitals reviewed.  Constitutional:      General: She is not in acute distress.    Appearance: Normal appearance. She is not ill-appearing, toxic-appearing or diaphoretic.  Eyes:     General:        Right eye: No discharge.        Left eye: No discharge.     Conjunctiva/sclera: Conjunctivae normal.  Cardiovascular:     Rate and Rhythm: Normal rate and regular rhythm.     Heart sounds: Normal heart sounds. No murmur heard.    No friction rub. No gallop.  Pulmonary:     Effort: Pulmonary effort is normal. No respiratory distress.     Breath sounds: Normal breath sounds.  Musculoskeletal:        General: Normal range of motion.  Skin:    General: Skin is warm and dry.  Neurological:     General: No focal deficit present.     Mental Status: She is alert and oriented to person, place, and time. Mental status is at baseline.  Psychiatric:        Mood and Affect: Mood normal.         Behavior: Behavior normal.        Thought Content: Thought content normal.        Judgment: Judgment normal.      Assessment & Plan:  Primary hypertension Assessment & Plan: Recommend to increase Metoprolol to 25mg  from 12.5mg  daily. Suspect blood pressure is actually a little higher than her readings suggest. Based on readings with cuff and manual cuff today in office, the readings were off. She is going to try to start taking her blood pressure with her regular blood pressure cuff that she has in her RV. Recommend to monitor your blood pressure while out of town. She reports she is leaving Tuesday for [place] and will not return until February or later. Advised if she got the opportunity, please upload readings and will provide feedback. Ordered CMP to assess kidney function before leaving.   Orders: -     Comprehensive metabolic panel -     Metoprolol Succinate ER; Take 1 tablet (25 mg total) by mouth daily.  Chronic kidney disease, stage 3b (HCC) -     Comprehensive metabolic panel   1.Review health maintenance:  -Colonoscopy-2014; Placed a referral to GI at previous visit, she is going  to reach out to GI to make an appointment after her return from [place].  -Covid booster: [facility] [address] and [address] weeks ago (Noted from previous visit)  -Bone Density: Within the last 2 years with PCP; will try to look up in chart  -Tdap vaccine: Unsure; Recommend to obtain at your local pharmacy  -Hep C screening: Will order when labs need to be completed.  -Influenza vaccine: [facility] [address] and [address]- 2 weeks ago(Noted from previous visit)  -Mammogram: Ordered at previous visit  -Medicare Annual [employer] telehealth with health coach -Zoster vaccine: Patient will upload her vaccine. She had it outside of [place].  2.Advised to please make a follow up as soon as she returns from [place].   Zandra Abts, NP

## 2023-04-22 ENCOUNTER — Other Ambulatory Visit: Payer: Self-pay | Admitting: Family Medicine

## 2023-04-22 DIAGNOSIS — I10 Essential (primary) hypertension: Secondary | ICD-10-CM

## 2023-05-18 ENCOUNTER — Other Ambulatory Visit: Payer: Self-pay

## 2023-05-18 ENCOUNTER — Telehealth: Payer: Self-pay

## 2023-05-18 DIAGNOSIS — N1832 Chronic kidney disease, stage 3b: Secondary | ICD-10-CM

## 2023-05-18 NOTE — Telephone Encounter (Signed)
 Copied from CRM [id number]. Topic: Appointments - Appointment Scheduling >> May 18, 2023 11:01 AM Truddie Crumble wrote: Patient/patient representative is calling to schedule an appointment. Refer to attachments for appointment information.   Patient called stating she would like the doctor to resubmit a kidney referral for her because the referral closed on her and she was out of town

## 2023-05-24 NOTE — Progress Notes (Unsigned)
   Rubin Payor, PhD, LAT, ATC acting as a scribe for Clementeen Graham, MD.  Jenna Caldwell is a 68 y.o. female who presents to [facility] at [facility] today for R knee pain x ***. Pt locates pain to ***  R Knee swelling: Mechanical symptoms: Aggravates: Treatments tried:  Pt also c/o R hip pain x ***. Pt locates pain to ***  Radiates: Aggravates:  Pertinent review of systems: ***  Relevant historical information: ***   Exam:  There were no vitals taken for this visit. General: Well Developed, well nourished, and in no acute distress.   MSK: ***    Lab and Radiology Results No results found for this or any previous visit (from the past 72 hours). No results found.     Assessment and Plan: 68 y.o. female with ***   PDMP not reviewed this encounter. No orders of the defined types were placed in this encounter.  No orders of the defined types were placed in this encounter.    Discussed warning signs or symptoms. Please see discharge instructions. Patient expresses understanding.   ***

## 2023-05-25 ENCOUNTER — Encounter: Payer: Self-pay | Admitting: Family Medicine

## 2023-05-25 ENCOUNTER — Ambulatory Visit: Admitting: Family Medicine

## 2023-05-25 ENCOUNTER — Ambulatory Visit (INDEPENDENT_AMBULATORY_CARE_PROVIDER_SITE_OTHER)

## 2023-05-25 ENCOUNTER — Other Ambulatory Visit: Payer: Self-pay

## 2023-05-25 VITALS — BP 158/78 | HR 82 | Ht 63.5 in | Wt 185.0 lb

## 2023-05-25 DIAGNOSIS — M5441 Lumbago with sciatica, right side: Secondary | ICD-10-CM | POA: Diagnosis not present

## 2023-05-25 DIAGNOSIS — M25561 Pain in right knee: Secondary | ICD-10-CM

## 2023-05-25 DIAGNOSIS — M25551 Pain in right hip: Secondary | ICD-10-CM | POA: Diagnosis not present

## 2023-05-25 DIAGNOSIS — I10 Essential (primary) hypertension: Secondary | ICD-10-CM

## 2023-05-25 DIAGNOSIS — N183 Chronic kidney disease, stage 3 unspecified: Secondary | ICD-10-CM | POA: Insufficient documentation

## 2023-05-25 DIAGNOSIS — G8929 Other chronic pain: Secondary | ICD-10-CM

## 2023-05-25 DIAGNOSIS — N1832 Chronic kidney disease, stage 3b: Secondary | ICD-10-CM

## 2023-05-25 MED ORDER — TIZANIDINE HCL 2 MG PO TABS
2.0000 mg | ORAL_TABLET | Freq: Three times a day (TID) | ORAL | 1 refills | Status: AC | PRN
Start: 2023-05-25 — End: ?

## 2023-05-25 NOTE — Patient Instructions (Addendum)
 Thank you for coming in today.   Please get an Xray today before you leave   I've referred you to Physical Therapy.  Let us know if you don't hear from them in one week.   OK to use tylenol arthritis 2 pills ever 8 hours as needed.   Use tizinidine muscle relaxer 1-2 pills at bedtime as needed.   Recheck in about 1 month.   If we need to more like a cortisone shot I can do that sooner if you tell me to.

## 2023-05-27 ENCOUNTER — Encounter: Payer: Self-pay | Admitting: Family Medicine

## 2023-05-27 NOTE — Progress Notes (Signed)
 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

## 2023-06-01 ENCOUNTER — Ambulatory Visit: Admitting: Physical Therapy

## 2023-06-06 NOTE — Progress Notes (Signed)
 Right knee x-ray shows medial arthritis

## 2023-06-06 NOTE — Progress Notes (Signed)
 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

## 2023-06-07 ENCOUNTER — Other Ambulatory Visit: Payer: Self-pay

## 2023-06-07 ENCOUNTER — Encounter: Payer: Self-pay | Admitting: Physical Therapy

## 2023-06-07 ENCOUNTER — Ambulatory Visit: Attending: Family Medicine | Admitting: Physical Therapy

## 2023-06-07 DIAGNOSIS — M25551 Pain in right hip: Secondary | ICD-10-CM | POA: Diagnosis present

## 2023-06-07 DIAGNOSIS — R262 Difficulty in walking, not elsewhere classified: Secondary | ICD-10-CM | POA: Insufficient documentation

## 2023-06-07 DIAGNOSIS — M5459 Other low back pain: Secondary | ICD-10-CM

## 2023-06-07 DIAGNOSIS — M6281 Muscle weakness (generalized): Secondary | ICD-10-CM | POA: Diagnosis present

## 2023-06-07 DIAGNOSIS — R293 Abnormal posture: Secondary | ICD-10-CM | POA: Diagnosis present

## 2023-06-07 DIAGNOSIS — M25561 Pain in right knee: Secondary | ICD-10-CM

## 2023-06-07 DIAGNOSIS — R252 Cramp and spasm: Secondary | ICD-10-CM

## 2023-06-07 NOTE — Patient Instructions (Signed)
 [facility] Welcome to [facility]! Here you will find all the information you will need regarding your pool therapy. If you have further questions at any time, please call our office at [phone]. After completing your initial evaluation in the [facility], you may be eligible to complete a portion of your therapy in the pool. A typical week of therapy will consist of 1-2 typical physical therapy visits at our [facility] location and an additional session of therapy in the pool located at the [facility] at [facility]. [address], [place] [address]. The phone number at the pool site is [phone]. Please call this number if you are running late or need to cancel your appointment.  Aquatic therapy will be offered on Wednesday mornings and Friday afternoons. Each session will last approximately 45 minutes. All scheduling and payments for aquatic therapy sessions, including cancelations, will be done through our [facility] location.  To be eligible for aquatic therapy, these criteria must be met: You must be able to independently change in the locker room and get to the pool deck. A caregiver can come with you to help if needed. There are benches for a caregiver to sit on next to the pool. No one with an open wound is permitted in the pool.  Handicap parking is available in the front and there is a drop off option for even closer accessibility. Please arrive 15 minutes prior to your appointment to prepare for your pool session. You must sign in at the front desk upon your arrival. Please be sure to attend to any toileting needs prior to entering the pool. Locker rooms for changing are available.  There is direct access to the pool deck from the locker room. You can lock your belongings in a locker or bring them with you poolside. Your therapist will greet you on the pool deck. There may be other swimmers in the pool at the  same time but your session is one-on-one with the therapist.  Trigger Point Dry Needling  What is Trigger Point Dry Needling (DN)? DN is a physical therapy technique used to treat muscle pain and dysfunction. Specifically, DN helps deactivate muscle trigger points (muscle knots).  A thin filiform needle is used to penetrate the skin and stimulate the underlying trigger point. The goal is for a local twitch response (LTR) to occur and for the trigger point to relax. No medication of any kind is injected during the procedure.   What Does Trigger Point Dry Needling Feel Like?  The procedure feels different for each individual patient. Some patients report that they do not actually feel the needle enter the skin and overall the process is not painful. Very mild bleeding may occur. However, many patients feel a deep cramping in the muscle in which the needle was inserted. This is the local twitch response.   How Will I feel after the treatment? Soreness is normal, and the onset of soreness may not occur for a few hours. Typically this soreness does not last longer than two days.  Bruising is uncommon, however; ice can be used to decrease any possible bruising.  In rare cases feeling tired or nauseous after the treatment is normal. In addition, your symptoms may get worse before they get better, this period will typically not last longer than 24 hours.   What Can I do After My Treatment? Increase your hydration by drinking more water for the next 24 hours.  You  may place ice or heat on the areas treated that have become sore, however, do not use heat on inflamed or bruised areas. Heat often brings more relief post needling. You can continue your regular activities, but vigorous activity is not recommended initially after the treatment for 24 hours. DN is best combined with other physical therapy such as strengthening, stretching, and other therapies.   What are the complications? While your  therapist has had extensive training in minimizing the risks of trigger point dry needling, it is important to understand the risks of any procedure.  Risks include bleeding, pain, fatigue, hematoma, infection, vertigo, nausea or nerve involvement. Monitor for any changes to your skin or sensation. Contact your therapist or MD with concerns.  A rare but serious complication is a pneumothorax over or near your middle and upper chest and back If you have dry needling in this area, monitor for the following symptoms: Shortness of breath on exertion and/or Difficulty taking a deep breath and/or Chest Pain and/or A dry cough If any of the above symptoms develop, please go to the nearest emergency room or call 911. Tell them you had dry needling over your thorax and report any symptoms you are having. Please follow-up with your treating therapist after you complete the medical evaluation.   [facility]  [address] [place] [address].  [phone]

## 2023-06-07 NOTE — Therapy (Signed)
 OUTPATIENT PHYSICAL THERAPY THORACOLUMBAR EVALUATION   Patient Name: Claramae Rigdon MRN: [id number] DOB:[birth year], 68 y.o., female Today's Date: 06/07/2023  END OF SESSION:  PT End of Session - 06/07/23 1345     Visit Number 1    Date for PT Re-Evaluation 08/02/23    Authorization Type Medicare part A/B    Progress Note Due on Visit 10    PT Start Time 1230    PT Stop Time 1325    PT Time Calculation (min) 55 min    Activity Tolerance Patient tolerated treatment well    Behavior During Therapy WFL for tasks assessed/performed             Past Medical History:  Diagnosis Date   Allergy    Sulfa and metformin severe reaction   Anxiety    Arthritis    Depression    Diabetes mellitus without complication (HCC) 2003   Pre diabetes   Hypertension    Osteopenia    Renal disease    Stage 3B CKD   Past Surgical History:  Procedure Laterality Date   ABLATION     Endometrial   APPENDECTOMY  1967   In sixth gradw   DILATION AND CURETTAGE OF UTERUS     KNEE SURGERY Left    Menisus   MOUTH SURGERY     08/2011   TONSILLECTOMY     Patient Active Problem List   Diagnosis Date Noted   CKD (chronic kidney disease) stage 3, GFR 30-59 ml/min (HCC) 05/25/2023   Anxiety and depression 12/10/2022   Primary hypertension 12/10/2022    PCP: Rebecca Campus, NP  REFERRING PROVIDER: Syliva Even, MD  REFERRING DIAG: [phone] (ICD-10-CM) - Chronic pain of right knee M25.551 (ICD-10-CM) - Right hip pain M54.41,G89.29 (ICD-10-CM) - Chronic right-sided low back pain with right-sided sciatica  Rationale for Evaluation and Treatment: Rehabilitation  THERAPY DIAG:  Cramp and spasm  Other low back pain  Pain in right hip  Acute pain of right knee  ONSET DATE: moved 5 mos ago and symptoms flared up - fell down steps when carrying items  SUBJECTIVE:                                                                                                                                                                                            SUBJECTIVE STATEMENT: Pt moved 5mos ago and during packing process fell down steps which started Rt hip pain.  Also traveled in new RV to [place] which exacerbated pain and had another fall (backwards) onto Rt hip. Have been in RV for several months.  Tight spaces make for difficult  getting around at times.  This all made me feel like maybe I need some PT.  I am realizing I am better when I am up and moving. Will be here in [place] until later in the summer when we will go out on our RV again.  Pain starts in Rt low back in a specific place and travels down the leg (Pt outlines the L4 nerve root pattern). Worse in the AM and better with movement but yet I can overdo it. I am afraid of falling - my knees are shaky and didn't used to be.  PERTINENT HISTORY:  Lt meniscus surgery 2008  PAIN:  PAIN:  Are you having pain? Yes NPRS scale: unsure but never a 0/10 Pain location: Rt SI region and wraps to lateral hip, anterior thigh, ant/medial leg Pain orientation: Right  PAIN TYPE: aching and sharp (medial knee) and deep Pain description: constant but level fluctuates Aggravating factors: worse in the AM Relieving factors: movement but not overdoing, wears knee brace on Rt   PRECAUTIONS: Fall  RED FLAGS: None   WEIGHT BEARING RESTRICTIONS: No  FALLS:  Has patient fallen in last 6 months? Yes. Number of falls 2 - fell down stairs carrying box, fell adjusting shoe on pool deck  LIVING ENVIRONMENT: Lives with: lives with their spouse Lives in: House/apartment Stairs: Yes: Internal: 4 levels steps;   and External: none from garage steps;   Has following equipment at home: None  OCCUPATION: retired  PLOF: Independent  PATIENT GOALS: reduce pain in hip, improve balance and strength, improve balance confidence  NEXT MD VISIT: 2 weeks  OBJECTIVE:  Note: Objective measures were completed at Evaluation unless otherwise  noted.  DIAGNOSTIC FINDINGS:  2025 xrays: Pelvic/Hip IMPRESSION: 1. Mild bilateral hip osteoarthritis, left-greater-than-right. 2. Moderate pubic symphysis osteoarthritis.  Rt knee IMPRESSION: Mild-to-moderate medial compartment and patellofemoral compartment osteoarthritis.  Lumbar: IMPRESSION: Degenerative joint changes of lumbar spine.  PATIENT SURVEYS:  Modified Oswestry 34%   COGNITION: Overall cognitive status: Within functional limits for tasks assessed     SENSATION: WFL  MUSCLE LENGTH:   POSTURE: increased lumbar lordosis  PALPATION: Very tender: Rt side gluteals, QL, greater trochanter  LUMBAR ROM:   AROM eval  Flexion Full, HS stretch end range  Extension Full no pain  Right lateral flexion full  Left lateral flexion 75% compared to Rt flexion, stretch on Rt side  Right rotation WNL, Rt pain  Left rotation WNL   (Blank rows = not tested)  LOWER EXTREMITY ROM:    Bil hips WNL  Passive  Right eval Left eval  Hip flexion    Hip extension    Hip abduction    Hip adduction    Hip internal rotation    Hip external rotation    Knee flexion 125 with pain 125  Knee extension    Ankle dorsiflexion    Ankle plantarflexion    Ankle inversion    Ankle eversion     (Blank rows = not tested)  LOWER EXTREMITY MMT:   4/5 Rt hip abd and ER Rt knee 4/5 with mild pain inhibition   FUNCTIONAL TESTS:  Challenged in SLS balance bil LE Able to squat but unsteady    TREATMENT DATE:  06/07/23: Discussed evaluation findings, initiated HEP               Made video of sidelying "X" stretch on Pt's phone for Rt QL stretch - not in [employer] Introduced education about DN and aquatic exercise  program - handsouts given  Prone manual traction S1 away from L5 top-down approach                                                                                                        PATIENT EDUCATION:  Education details: [id number], aquatic info and DN info  given Person educated: Patient Education method: Explanation, Demonstration, Verbal cues, and Handouts Education comprehension: verbalized understanding and returned demonstration  HOME EXERCISE PROGRAM: Access Code: [email or web address] URL: [email or web address] Date: 06/07/2023 Prepared by: Minor Amble Angeles Paolucci  Exercises - Supine Posterior Pelvic Tilt  - 1 x daily - 7 x weekly - 1 sets - 10 reps - Hooklying Single Knee to Chest Stretch  - 1 x daily - 7 x weekly - 1 sets - 10 reps - Supine Lower Trunk Rotation  - 1 x daily - 7 x weekly - 1 sets - 10 reps - Supine Double Knee to Chest  - 1 x daily - 7 x weekly - 1 sets - 10 reps - Seated Quadratus Lumborum Stretch in Chair  - 1 x daily - 7 x weekly - 1 sets - 2 reps - 20-30 hold - Seated Long Arc Quad  - 1 x daily - 7 x weekly - 1 sets - 10 reps  ASSESSMENT:  CLINICAL IMPRESSION: Patient is a 68 y.o. female who was seen today for physical therapy evaluation and treatment for Rt back, hip and knee pain.  She has a history of 2 falls which started and exacerbated her pain.  She also spent the last few months traveling and living in a new RV which caused her to feel stiff overall. She reports shakiness in bil knees which she has never had and would like to improve her balance confidence.  Xrays show arthritis present in lumbar spine, Rt hip and medial aspect of Rt knee.  Pt has full ROM of bil LE with pain with end range Rt knee flexion.  Trunk ROM is limited into Lt SB with Rt QL tightness.  Pt has signif tenderness and TP present in gluteals, QL and lumbar spine for which we discussed DN.  Initiated HEP to try in AM given she wakes up so stiff.   Pt will benefit from skilled PT to address evaluation findings and maximize her QOL and activity participation.  OBJECTIVE IMPAIRMENTS: decreased balance, decreased coordination, decreased mobility, decreased strength, increased muscle spasms, impaired flexibility, improper body mechanics, postural  dysfunction, and pain.   ACTIVITY LIMITATIONS: bending, sitting, standing, squatting, sleeping, and stairs  PARTICIPATION LIMITATIONS: cleaning, shopping, and community activity  PERSONAL FACTORS: 1 comorbidity: Hx of Lt meniscus Sx and ongoing Lt heel pain  are also affecting patient's functional outcome.   REHAB POTENTIAL: Excellent  CLINICAL DECISION MAKING: Stable/uncomplicated  EVALUATION COMPLEXITY: Moderate   GOALS: Goals reviewed with patient? Yes  SHORT TERM GOALS: Target date: 07/05/23  Pt will be ind with initial HEP Baseline: Goal status: INITIAL  2.  Pt will demo good alignment and body mechanics for functional squat Baseline:  Goal status: INITIAL  3.  Pt  will have full symmetrical Lt SB resulting from improved Rt QL flexibility Baseline:  Goal status: INITIAL  4. Pt will learn aquatic program over 1-2 visits to perform ind.  Goal Status: INITIAL    LONG TERM GOALS: Target date: 08/02/23  Pt will be ind with advanced HEP Baseline:  Goal status: INITIAL  2.  Pt will improve ODI score to </= 24% to demo improved function Baseline: 17/50, 34% Goal status: INITIAL  3.  Pt will report improved balance confidence in busy environments such as with her grandkids. Baseline:  Goal status: INITIAL  4.  Pt will achieve 5/5 bil LE strength to improve functional task performance throughout day. Baseline:  Goal status: INITIAL  5.  Pt will report at least 70% reduction of pain in Rt lumbar spine and Rt LE. Baseline:  Goal status: INITIAL    PLAN:  PT FREQUENCY: 2x/week  PT DURATION: 8 weeks  PLANNED INTERVENTIONS: 97110-Therapeutic exercises, 97530- Therapeutic activity, 97112- Neuromuscular re-education, 97535- Self Care, 16109- Manual therapy, [phone]- Aquatic Therapy, [phone]- Electrical stimulation (unattended), [id number]- Ultrasound, [id number]- Traction (mechanical), [id number]- Ionotophoresis 4mg /ml Dexamethasone, Patient/Family education, Balance training, Dry  Needling, Joint mobilization, Spinal mobilization, Cryotherapy, and Moist heat.  PLAN FOR NEXT SESSION: 1-2 sessions of aquatic to learn a program for when she joins [facility], DN Rt QL, lumbar and Rt gluteals, LE strength, balance, possible ionto to Rt greater trochanter   Alek Poncedeleon, PT 06/07/23 1:46 PM

## 2023-06-10 ENCOUNTER — Ambulatory Visit

## 2023-06-10 DIAGNOSIS — M25551 Pain in right hip: Secondary | ICD-10-CM

## 2023-06-10 DIAGNOSIS — R252 Cramp and spasm: Secondary | ICD-10-CM | POA: Diagnosis not present

## 2023-06-10 DIAGNOSIS — M5459 Other low back pain: Secondary | ICD-10-CM

## 2023-06-10 DIAGNOSIS — R262 Difficulty in walking, not elsewhere classified: Secondary | ICD-10-CM

## 2023-06-10 DIAGNOSIS — M6281 Muscle weakness (generalized): Secondary | ICD-10-CM

## 2023-06-10 DIAGNOSIS — M25561 Pain in right knee: Secondary | ICD-10-CM

## 2023-06-10 DIAGNOSIS — R293 Abnormal posture: Secondary | ICD-10-CM

## 2023-06-10 NOTE — Therapy (Signed)
 OUTPATIENT PHYSICAL THERAPY THORACOLUMBAR TREATMENT NOTE   Patient Name: Jenna Caldwell MRN: [id number] DOB:04-21-1955, 68 y.o., female Today's Date: 06/10/2023  END OF SESSION:  PT End of Session - 06/10/23 4098     Visit Number 2    Date for PT Re-Evaluation 08/02/23    Authorization Type Medicare part A/B    Progress Note Due on Visit 10    PT Start Time 0924    PT Stop Time 1008    PT Time Calculation (min) 44 min    Activity Tolerance Patient tolerated treatment well    Behavior During Therapy WFL for tasks assessed/performed             Past Medical History:  Diagnosis Date   Allergy    Sulfa and metformin severe reaction   Anxiety    Arthritis    Depression    Diabetes mellitus without complication (HCC) 2003   Pre diabetes   Hypertension    Osteopenia    Renal disease    Stage 3B CKD   Past Surgical History:  Procedure Laterality Date   ABLATION     Endometrial   APPENDECTOMY  1967   In sixth gradw   DILATION AND CURETTAGE OF UTERUS     KNEE SURGERY Left    Menisus   MOUTH SURGERY     08/2011   TONSILLECTOMY     Patient Active Problem List   Diagnosis Date Noted   CKD (chronic kidney disease) stage 3, GFR 30-59 ml/min (HCC) 05/25/2023   Anxiety and depression 12/10/2022   Primary hypertension 12/10/2022    PCP: Rebecca Campus, NP  REFERRING PROVIDER: Syliva Even, MD  REFERRING DIAG: [phone] (ICD-10-CM) - Chronic pain of right knee M25.551 (ICD-10-CM) - Right hip pain M54.41,G89.29 (ICD-10-CM) - Chronic right-sided low back pain with right-sided sciatica  Rationale for Evaluation and Treatment: Rehabilitation  THERAPY DIAG:  Cramp and spasm  Other low back pain  Pain in right hip  Difficulty in walking, not elsewhere classified  Acute pain of right knee  Muscle weakness (generalized)  Abnormal posture  ONSET DATE: moved 5 mos ago and symptoms flared up - fell down steps when carrying items  SUBJECTIVE:                                                                                                                                                                                            SUBJECTIVE STATEMENT: Patient reports she is about the same.     Initial eval: Pt moved 5mos ago and during packing process fell down steps which started Rt hip pain.  Also traveled in  new RV to [place] which exacerbated pain and had another fall (backwards) onto Rt hip. Have been in RV for several months.  Tight spaces make for difficult getting around at times.  This all made me feel like maybe I need some PT.  I am realizing I am better when I am up and moving. Will be here in [place] until later in the summer when we will go out on our RV again.  Pain starts in Rt low back in a specific place and travels down the leg (Pt outlines the L4 nerve root pattern). Worse in the AM and better with movement but yet I can overdo it. I am afraid of falling - my knees are shaky and didn't used to be.  PERTINENT HISTORY:  Lt meniscus surgery 2008  PAIN:  PAIN:   06/10/23 Are you having pain? Yes NPRS scale: unsure but never a 5/10 Pain location: Rt SI region and wraps to lateral hip, anterior thigh, ant/medial leg Pain orientation: Right  PAIN TYPE: aching and sharp (medial knee) and deep Pain description: constant but level fluctuates Aggravating factors: worse in the AM Relieving factors: movement but not overdoing, wears knee brace on Rt   PRECAUTIONS: Fall  RED FLAGS: None   WEIGHT BEARING RESTRICTIONS: No  FALLS:  Has patient fallen in last 6 months? Yes. Number of falls 2 - fell down stairs carrying box, fell adjusting shoe on pool deck  LIVING ENVIRONMENT: Lives with: lives with their spouse Lives in: House/apartment Stairs: Yes: Internal: 4 levels steps;   and External: none from garage steps;   Has following equipment at home: None  OCCUPATION: retired  PLOF: Independent  PATIENT GOALS: reduce pain in  hip, improve balance and strength, improve balance confidence  NEXT MD VISIT: 2 weeks  OBJECTIVE:  Note: Objective measures were completed at Evaluation unless otherwise noted.  DIAGNOSTIC FINDINGS:  2025 xrays: Pelvic/Hip IMPRESSION: 1. Mild bilateral hip osteoarthritis, left-greater-than-right. 2. Moderate pubic symphysis osteoarthritis.  Rt knee IMPRESSION: Mild-to-moderate medial compartment and patellofemoral compartment osteoarthritis.  Lumbar: IMPRESSION: Degenerative joint changes of lumbar spine.  PATIENT SURVEYS:  Modified Oswestry 34%   COGNITION: Overall cognitive status: Within functional limits for tasks assessed     SENSATION: WFL  MUSCLE LENGTH:   POSTURE: increased lumbar lordosis  PALPATION: Very tender: Rt side gluteals, QL, greater trochanter  LUMBAR ROM:   AROM eval  Flexion Full, HS stretch end range  Extension Full no pain  Right lateral flexion full  Left lateral flexion 75% compared to Rt flexion, stretch on Rt side  Right rotation WNL, Rt pain  Left rotation WNL   (Blank rows = not tested)  LOWER EXTREMITY ROM:    Bil hips WNL  Passive  Right eval Left eval  Hip flexion    Hip extension    Hip abduction    Hip adduction    Hip internal rotation    Hip external rotation    Knee flexion 125 with pain 125  Knee extension    Ankle dorsiflexion    Ankle plantarflexion    Ankle inversion    Ankle eversion     (Blank rows = not tested)  LOWER EXTREMITY MMT:   4/5 Rt hip abd and ER Rt knee 4/5 with mild pain inhibition   FUNCTIONAL TESTS:  Challenged in SLS balance bil LE Able to squat but unsteady    TREATMENT DATE:  06/10/23: Nustep x 7 min level 3 (PT present to discuss status) Verbally reviewed  initial HEP  Standing hamstring stretch 2 x 30 sec each Standing quad/hip flexor stretch 2 x 30 sec each Seated piriformis stretch 2 x 30 sec each Trigger Point Dry Needling Initial Treatment: Pt instructed on Dry  Needling rational, procedures, and possible side effects. Pt instructed to expect mild to moderate muscle soreness later in the day and/or into the next day.  Pt instructed in methods to reduce muscle soreness. Pt instructed to continue prescribed HEP. Because Dry Needling was performed over or adjacent to a lung field, pt was educated on S/S of pneumothorax and to seek immediate medical attention should they occur.  Patient was educated on signs and symptoms of infection and other risk factors and advised to seek medical attention should they occur.  Patient verbalized understanding of these instructions and education.  Patient Verbal Consent Given: Yes Education Handout Provided: Previously Provided Muscles Treated: lumbar multifidi, iliocostalis both bilaterally, glut max and minimus, piriformis all right side Electrical Stimulation Performed: No Treatment Response/Outcome: Skilled palpation used to identify taut bands and trigger points.  Once identified, dry needling techniques used to treat these areas.  Twitch response ellicited along with palpable elongation of muscle in the glut area, deep ache response elicited in piriformis.  Following treatment, patient reported minor soreness.   06/07/23: Discussed evaluation findings, initiated HEP               Made video of sidelying "X" stretch on Pt's phone for Rt QL stretch - not in [employer] Introduced education about DN and aquatic exercise program - handsouts given  Prone manual traction S1 away from L5 top-down approach                                                                                                        PATIENT EDUCATION:  Education details: [id number], aquatic info and DN info given Person educated: Patient Education method: Explanation, Demonstration, Verbal cues, and Handouts Education comprehension: verbalized understanding and returned demonstration  HOME EXERCISE PROGRAM: Access Code: [email or web address] URL:  [email or web address] Date: 06/07/2023 Prepared by: Minor Amble Beuhring  Exercises - Supine Posterior Pelvic Tilt  - 1 x daily - 7 x weekly - 1 sets - 10 reps - Hooklying Single Knee to Chest Stretch  - 1 x daily - 7 x weekly - 1 sets - 10 reps - Supine Lower Trunk Rotation  - 1 x daily - 7 x weekly - 1 sets - 10 reps - Supine Double Knee to Chest  - 1 x daily - 7 x weekly - 1 sets - 10 reps - Seated Quadratus Lumborum Stretch in Chair  - 1 x daily - 7 x weekly - 1 sets - 2 reps - 20-30 hold - Seated Long Arc Quad  - 1 x daily - 7 x weekly - 1 sets - 10 reps  ASSESSMENT:  CLINICAL IMPRESSION: Lesslie was able to tolerate addition of several LE stretches.  We emphasized doing all of these bilaterally to correct any compensatory patterns.  We initiated DN today.  She had several deep ache and twitch responses.  We held on QL due to patient needing to get accommodated to doing the DN for the first time.  We will likely add this in next visit depending on patients response to DN #1.   Pt will benefit from skilled PT to address evaluation findings and maximize her QOL and activity participation.  OBJECTIVE IMPAIRMENTS: decreased balance, decreased coordination, decreased mobility, decreased strength, increased muscle spasms, impaired flexibility, improper body mechanics, postural dysfunction, and pain.   ACTIVITY LIMITATIONS: bending, sitting, standing, squatting, sleeping, and stairs  PARTICIPATION LIMITATIONS: cleaning, shopping, and community activity  PERSONAL FACTORS: 1 comorbidity: Hx of Lt meniscus Sx and ongoing Lt heel pain  are also affecting patient's functional outcome.   REHAB POTENTIAL: Excellent  CLINICAL DECISION MAKING: Stable/uncomplicated  EVALUATION COMPLEXITY: Moderate   GOALS: Goals reviewed with patient? Yes  SHORT TERM GOALS: Target date: 07/05/23  Pt will be ind with initial HEP Baseline: Goal status: INITIAL  2.  Pt will demo good alignment and  body mechanics for functional squat Baseline:  Goal status: INITIAL  3.  Pt will have full symmetrical Lt SB resulting from improved Rt QL flexibility Baseline:  Goal status: INITIAL  4. Pt will learn aquatic program over 1-2 visits to perform ind.  Goal Status: INITIAL    LONG TERM GOALS: Target date: 08/02/23  Pt will be ind with advanced HEP Baseline:  Goal status: INITIAL  2.  Pt will improve ODI score to </= 24% to demo improved function Baseline: 17/50, 34% Goal status: INITIAL  3.  Pt will report improved balance confidence in busy environments such as with her grandkids. Baseline:  Goal status: INITIAL  4.  Pt will achieve 5/5 bil LE strength to improve functional task performance throughout day. Baseline:  Goal status: INITIAL  5.  Pt will report at least 70% reduction of pain in Rt lumbar spine and Rt LE. Baseline:  Goal status: INITIAL    PLAN:  PT FREQUENCY: 2x/week  PT DURATION: 8 weeks  PLANNED INTERVENTIONS: 97110-Therapeutic exercises, 97530- Therapeutic activity, 97112- Neuromuscular re-education, 97535- Self Care, 53664- Manual therapy, [phone]- Aquatic Therapy, [phone]- Electrical stimulation (unattended), [id number]- Ultrasound, [id number]- Traction (mechanical), [id number]- Ionotophoresis 4mg /ml Dexamethasone, Patient/Family education, Balance training, Dry Needling, Joint mobilization, Spinal mobilization, Cryotherapy, and Moist heat.  PLAN FOR NEXT SESSION: Proceed with 1-2 sessions of aquatic to learn a program for when she joins [facility], assess response to DN #1 to glutes, piriformis and lumbar paraspinals, do DN to Rt QL, and repeat lumbar and Rt gluteals if good response last visit.   LE strength, balance, possible ionto to Rt greater trochanter  Bridgette Campus B. Ellamae Lybeck, PT 06/10/23 10:33 AM [facility] [address], [address] [place], [place] [address] Phone # [phone] Fax [phone]'

## 2023-06-13 ENCOUNTER — Other Ambulatory Visit: Payer: Self-pay | Admitting: Family Medicine

## 2023-06-13 DIAGNOSIS — I10 Essential (primary) hypertension: Secondary | ICD-10-CM

## 2023-06-14 ENCOUNTER — Ambulatory Visit

## 2023-06-14 DIAGNOSIS — R252 Cramp and spasm: Secondary | ICD-10-CM | POA: Diagnosis not present

## 2023-06-14 DIAGNOSIS — R262 Difficulty in walking, not elsewhere classified: Secondary | ICD-10-CM

## 2023-06-14 DIAGNOSIS — M25551 Pain in right hip: Secondary | ICD-10-CM

## 2023-06-14 DIAGNOSIS — M5459 Other low back pain: Secondary | ICD-10-CM

## 2023-06-14 DIAGNOSIS — M6281 Muscle weakness (generalized): Secondary | ICD-10-CM

## 2023-06-14 DIAGNOSIS — M25561 Pain in right knee: Secondary | ICD-10-CM

## 2023-06-14 NOTE — Therapy (Signed)
 OUTPATIENT PHYSICAL THERAPY THORACOLUMBAR TREATMENT NOTE   Patient Name: Jenna Caldwell MRN: [id number] DOB:1955/06/28, 68 y.o., female Today's Date: 06/14/2023  END OF SESSION:  PT End of Session - 06/14/23 1030     Visit Number 3    Date for PT Re-Evaluation 08/02/23    Authorization Type Medicare part A/B    Progress Note Due on Visit 10    PT Start Time 0932    PT Stop Time 1014    PT Time Calculation (min) 42 min    Activity Tolerance Patient tolerated treatment well    Behavior During Therapy WFL for tasks assessed/performed              Past Medical History:  Diagnosis Date   Allergy    Sulfa and metformin severe reaction   Anxiety    Arthritis    Depression    Diabetes mellitus without complication (HCC) 2003   Pre diabetes   Hypertension    Osteopenia    Renal disease    Stage 3B CKD   Past Surgical History:  Procedure Laterality Date   ABLATION     Endometrial   APPENDECTOMY  1967   In sixth gradw   DILATION AND CURETTAGE OF UTERUS     KNEE SURGERY Left    Menisus   MOUTH SURGERY     08/2011   TONSILLECTOMY     Patient Active Problem List   Diagnosis Date Noted   CKD (chronic kidney disease) stage 3, GFR 30-59 ml/min (HCC) 05/25/2023   Anxiety and depression 12/10/2022   Primary hypertension 12/10/2022    PCP: Rebecca Campus, NP  REFERRING PROVIDER: Syliva Even, MD  REFERRING DIAG: [phone] (ICD-10-CM) - Chronic pain of right knee M25.551 (ICD-10-CM) - Right hip pain M54.41,G89.29 (ICD-10-CM) - Chronic right-sided low back pain with right-sided sciatica  Rationale for Evaluation and Treatment: Rehabilitation  THERAPY DIAG:  Cramp and spasm  Other low back pain  Pain in right hip  Difficulty in walking, not elsewhere classified  Acute pain of right knee  Muscle weakness (generalized)  ONSET DATE: moved 5 mos ago and symptoms flared up - fell down steps when carrying items  SUBJECTIVE:                                                                                                                                                                                            SUBJECTIVE STATEMENT: I felt better after dry needling. I felt a deep ache and felt looser.  I am busy at home with laundry and going up and down steps.     Initial eval: Pt  moved 5mos ago and during packing process fell down steps which started Rt hip pain.  Also traveled in new RV to [place] which exacerbated pain and had another fall (backwards) onto Rt hip. Have been in RV for several months.  Tight spaces make for difficult getting around at times.  This all made me feel like maybe I need some PT.  I am realizing I am better when I am up and moving. Will be here in [place] until later in the summer when we will go out on our RV again.  Pain starts in Rt low back in a specific place and travels down the leg (Pt outlines the L4 nerve root pattern). Worse in the AM and better with movement but yet I can overdo it. I am afraid of falling - my knees are shaky and didn't used to be.  PERTINENT HISTORY:  Lt meniscus surgery 2008  PAIN:  PAIN:   06/14/23 Are you having pain? Yes NPRS scale: unsure but never a 5/10 Pain location: Rt SI region and wraps to lateral hip, anterior thigh, ant/medial leg Pain orientation: Right  PAIN TYPE: aching and sharp (medial knee) and deep Pain description: constant but level fluctuates Aggravating factors: worse in the AM Relieving factors: movement but not overdoing, wears knee brace on Rt   PRECAUTIONS: Fall  RED FLAGS: None   WEIGHT BEARING RESTRICTIONS: No  FALLS:  Has patient fallen in last 6 months? Yes. Number of falls 2 - fell down stairs carrying box, fell adjusting shoe on pool deck  LIVING ENVIRONMENT: Lives with: lives with their spouse Lives in: House/apartment Stairs: Yes: Internal: 4 levels steps;   and External: none from garage steps;   Has following equipment at home:  None  OCCUPATION: retired  PLOF: Independent  PATIENT GOALS: reduce pain in hip, improve balance and strength, improve balance confidence  NEXT MD VISIT: 2 weeks  OBJECTIVE:  Note: Objective measures were completed at Evaluation unless otherwise noted.  DIAGNOSTIC FINDINGS:  2025 xrays: Pelvic/Hip IMPRESSION: 1. Mild bilateral hip osteoarthritis, left-greater-than-right. 2. Moderate pubic symphysis osteoarthritis.  Rt knee IMPRESSION: Mild-to-moderate medial compartment and patellofemoral compartment osteoarthritis.  Lumbar: IMPRESSION: Degenerative joint changes of lumbar spine.  PATIENT SURVEYS:  Modified Oswestry 34%   COGNITION: Overall cognitive status: Within functional limits for tasks assessed     SENSATION: WFL  MUSCLE LENGTH:   POSTURE: increased lumbar lordosis  PALPATION: Very tender: Rt side gluteals, QL, greater trochanter  LUMBAR ROM:   AROM eval  Flexion Full, HS stretch end range  Extension Full no pain  Right lateral flexion full  Left lateral flexion 75% compared to Rt flexion, stretch on Rt side  Right rotation WNL, Rt pain  Left rotation WNL   (Blank rows = not tested)  LOWER EXTREMITY ROM:    Bil hips WNL  Passive  Right eval Left eval  Hip flexion    Hip extension    Hip abduction    Hip adduction    Hip internal rotation    Hip external rotation    Knee flexion 125 with pain 125  Knee extension    Ankle dorsiflexion    Ankle plantarflexion    Ankle inversion    Ankle eversion     (Blank rows = not tested)  LOWER EXTREMITY MMT:   4/5 Rt hip abd and ER Rt knee 4/5 with mild pain inhibition   FUNCTIONAL TESTS:  Challenged in SLS balance bil LE Able to squat but unsteady  TREATMENT DATE:   06/14/23: Nustep x 6 min level 3 (PT present to discuss status) Sit to stand 2x10 Sidelying clam 2x10 Standing hamstring stretch 2 x 30 sec each Pelvic tilt with ball squeeze 5" hold 2x10 Sit to stand 2x10 Knee to  chest 2x20 seconds  Seated piriformis stretch 2 x 30 sec each Trigger Point Dry Needling  Subsequent Treatment: Instructions provided previously at initial dry needling treatment.   Patient Verbal Consent Given: Yes Education Handout Provided: Previously Provided Muscles Treated: Bil lumbar multifidi, Rt gluteals  Electrical Stimulation Performed: No Treatment Response/Outcome: improved tissue mobility and twitch response  Elongation and release to low back and Lt gluteals    06/10/23: Nustep x 7 min level 3 (PT present to discuss status) Verbally reviewed initial HEP  Standing hamstring stretch 2 x 30 sec each Standing quad/hip flexor stretch 2 x 30 sec each Seated piriformis stretch 2 x 30 sec each Trigger Point Dry Needling Initial Treatment: Pt instructed on Dry Needling rational, procedures, and possible side effects. Pt instructed to expect mild to moderate muscle soreness later in the day and/or into the next day.  Pt instructed in methods to reduce muscle soreness. Pt instructed to continue prescribed HEP. Because Dry Needling was performed over or adjacent to a lung field, pt was educated on S/S of pneumothorax and to seek immediate medical attention should they occur.  Patient was educated on signs and symptoms of infection and other risk factors and advised to seek medical attention should they occur.  Patient verbalized understanding of these instructions and education.  Patient Verbal Consent Given: Yes Education Handout Provided: Previously Provided Muscles Treated: lumbar multifidi, iliocostalis both bilaterally, glut max and minimus, piriformis all right side Electrical Stimulation Performed: No Treatment Response/Outcome: Skilled palpation used to identify taut bands and trigger points.  Once identified, dry needling techniques used to treat these areas.  Twitch response ellicited along with palpable elongation of muscle in the glut area, deep ache response elicited in  piriformis.  Following treatment, patient reported minor soreness.   06/07/23: Discussed evaluation findings, initiated HEP               Made video of sidelying "X" stretch on Pt's phone for Rt QL stretch - not in [employer] Introduced education about DN and aquatic exercise program - handsouts given  Prone manual traction S1 away from L5 top-down approach                                                                                                        PATIENT EDUCATION:  Education details: [id number], aquatic info and DN info given Person educated: Patient Education method: Explanation, Demonstration, Verbal cues, and Handouts Education comprehension: verbalized understanding and returned demonstration  HOME EXERCISE PROGRAM: Access Code: [email or web address] URL: [email or web address] Date: 06/14/2023 Prepared by: Loetta Ringer  Exercises - Supine Posterior Pelvic Tilt  - 1 x daily - 7 x weekly - 1 sets - 10 reps - Hooklying Single Knee to Chest Stretch  - 1 x daily - 7 x weekly - 1 sets -  10 reps - Supine Lower Trunk Rotation  - 1 x daily - 7 x weekly - 1 sets - 10 reps - Supine Double Knee to Chest  - 1 x daily - 7 x weekly - 1 sets - 10 reps - Seated Quadratus Lumborum Stretch in Chair  - 1 x daily - 7 x weekly - 1 sets - 2 reps - 20-30 hold - Seated Long Arc Quad  - 1 x daily - 7 x weekly - 1 sets - 10 reps - Clamshell  - 2 x daily - 7 x weekly - 2 sets - 10 reps - Sit to Stand Without Arm Support  - 2 x daily - 7 x weekly - 2 sets - 10 reps - Supine Hip Adduction Isometric with Ball  - 1 x daily - 7 x weekly - 2 sets - 10 reps - 5 hold  ASSESSMENT:  CLINICAL IMPRESSION: Pt had good response to dry needling last session with reported improved mobility. Pt is independent and compliant with HEP for flexibility and PT added strength to HEP.  Pt fatigues on Rt LE with these exercises and PT monitored for pain.  Good response to dry needling with improved tissue mobility and twitch  response in Rt gluteals and lumbar spine Rt>Lt.  She fatigues with daily tasks and will benefit from skilled PT to address pain and muscle strength and endurance.     OBJECTIVE IMPAIRMENTS: decreased balance, decreased coordination, decreased mobility, decreased strength, increased muscle spasms, impaired flexibility, improper body mechanics, postural dysfunction, and pain.   ACTIVITY LIMITATIONS: bending, sitting, standing, squatting, sleeping, and stairs  PARTICIPATION LIMITATIONS: cleaning, shopping, and community activity  PERSONAL FACTORS: 1 comorbidity: Hx of Lt meniscus Sx and ongoing Lt heel pain  are also affecting patient's functional outcome.   REHAB POTENTIAL: Excellent  CLINICAL DECISION MAKING: Stable/uncomplicated  EVALUATION COMPLEXITY: Moderate   GOALS: Goals reviewed with patient? Yes  SHORT TERM GOALS: Target date: 07/05/23  Pt will be ind with initial HEP Baseline: Goal status: MET  2.  Pt will demo good alignment and body mechanics for functional squat Baseline:  Goal status: INITIAL  3.  Pt will have full symmetrical Lt SB resulting from improved Rt QL flexibility Baseline:  Goal status: INITIAL  4. Pt will learn aquatic program over 1-2 visits to perform ind.  Goal Status: INITIAL    LONG TERM GOALS: Target date: 08/02/23  Pt will be ind with advanced HEP Baseline:  Goal status: INITIAL  2.  Pt will improve ODI score to </= 24% to demo improved function Baseline: 17/50, 34% Goal status: INITIAL  3.  Pt will report improved balance confidence in busy environments such as with her grandkids. Baseline:  Goal status: INITIAL  4.  Pt will achieve 5/5 bil LE strength to improve functional task performance throughout day. Baseline:  Goal status: INITIAL  5.  Pt will report at least 70% reduction of pain in Rt lumbar spine and Rt LE. Baseline:  Goal status: INITIAL    PLAN:  PT FREQUENCY: 2x/week  PT DURATION: 8 weeks  PLANNED  INTERVENTIONS: 97110-Therapeutic exercises, 97530- Therapeutic activity, 97112- Neuromuscular re-education, 97535- Self Care, 16109- Manual therapy, [phone]- Aquatic Therapy, [phone]- Electrical stimulation (unattended), [id number]- Ultrasound, [id number]- Traction (mechanical), [id number]- Ionotophoresis 4mg /ml Dexamethasone, Patient/Family education, Balance training, Dry Needling, Joint mobilization, Spinal mobilization, Cryotherapy, and Moist heat.  PLAN FOR NEXT SESSION: Proceed with 1-2 sessions of aquatic to learn a program for when she joins [facility], assess response to  DN #2 to glutes, piriformis and lumbar paraspinals, do DN to Rt QL.  Continue strength and endurance tasks.   LE strength, balance, possible ionto to Rt greater trochanter  Luella Sager, PT 06/14/23 10:38 AM  [facility] [address] [place], [place] [address] Phone # [phone] Fax [phone]'

## 2023-06-17 ENCOUNTER — Ambulatory Visit: Attending: Family Medicine

## 2023-06-17 DIAGNOSIS — M25551 Pain in right hip: Secondary | ICD-10-CM | POA: Diagnosis present

## 2023-06-17 DIAGNOSIS — R293 Abnormal posture: Secondary | ICD-10-CM | POA: Diagnosis present

## 2023-06-17 DIAGNOSIS — M6281 Muscle weakness (generalized): Secondary | ICD-10-CM | POA: Diagnosis present

## 2023-06-17 DIAGNOSIS — R262 Difficulty in walking, not elsewhere classified: Secondary | ICD-10-CM | POA: Insufficient documentation

## 2023-06-17 DIAGNOSIS — R252 Cramp and spasm: Secondary | ICD-10-CM | POA: Insufficient documentation

## 2023-06-17 DIAGNOSIS — M5459 Other low back pain: Secondary | ICD-10-CM | POA: Insufficient documentation

## 2023-06-17 DIAGNOSIS — M25561 Pain in right knee: Secondary | ICD-10-CM | POA: Diagnosis present

## 2023-06-17 NOTE — Therapy (Signed)
 OUTPATIENT PHYSICAL THERAPY THORACOLUMBAR TREATMENT NOTE   Patient Name: Nova Worman MRN: [id number] DOB:[birth year], 68 y.o., female Today's Date: 06/17/2023  END OF SESSION:  PT End of Session - 06/17/23 0938     Visit Number 4    Date for PT Re-Evaluation 08/02/23    Authorization Type Medicare part A/B    Progress Note Due on Visit 10    PT Start Time 0933    PT Stop Time 1015    PT Time Calculation (min) 42 min    Activity Tolerance Patient tolerated treatment well    Behavior During Therapy WFL for tasks assessed/performed              Past Medical History:  Diagnosis Date   Allergy    Sulfa and metformin severe reaction   Anxiety    Arthritis    Depression    Diabetes mellitus without complication (HCC) 2003   Pre diabetes   Hypertension    Osteopenia    Renal disease    Stage 3B CKD   Past Surgical History:  Procedure Laterality Date   ABLATION     Endometrial   APPENDECTOMY  1967   In sixth gradw   DILATION AND CURETTAGE OF UTERUS     KNEE SURGERY Left    Menisus   MOUTH SURGERY     08/2011   TONSILLECTOMY     Patient Active Problem List   Diagnosis Date Noted   CKD (chronic kidney disease) stage 3, GFR 30-59 ml/min (HCC) 05/25/2023   Anxiety and depression 12/10/2022   Primary hypertension 12/10/2022    PCP: Rebecca Campus, NP  REFERRING PROVIDER: Syliva Even, MD  REFERRING DIAG: [phone] (ICD-10-CM) - Chronic pain of right knee M25.551 (ICD-10-CM) - Right hip pain M54.41,G89.29 (ICD-10-CM) - Chronic right-sided low back pain with right-sided sciatica  Rationale for Evaluation and Treatment: Rehabilitation  THERAPY DIAG:  Muscle weakness (generalized)  Cramp and spasm  Other low back pain  Pain in right hip  Difficulty in walking, not elsewhere classified  Acute pain of right knee  Abnormal posture  ONSET DATE: moved 5 mos ago and symptoms flared up - fell down steps when carrying items  SUBJECTIVE:                                                                                                                                                                                            SUBJECTIVE STATEMENT: "I'm doing pretty good.  The right knee is really the most aggravating right now".     Initial eval: Pt moved 5mos ago and during packing process fell down steps  which started Rt hip pain.  Also traveled in new RV to [place] which exacerbated pain and had another fall (backwards) onto Rt hip. Have been in RV for several months.  Tight spaces make for difficult getting around at times.  This all made me feel like maybe I need some PT.  I am realizing I am better when I am up and moving. Will be here in [place] until later in the summer when we will go out on our RV again.  Pain starts in Rt low back in a specific place and travels down the leg (Pt outlines the L4 nerve root pattern). Worse in the AM and better with movement but yet I can overdo it. I am afraid of falling - my knees are shaky and didn't used to be.  PERTINENT HISTORY:  Lt meniscus surgery 2008  PAIN:  PAIN:   06/14/23 Are you having pain? Yes NPRS scale: unsure but never a 5/10 Pain location: Rt SI region and wraps to lateral hip, anterior thigh, ant/medial leg Pain orientation: Right  PAIN TYPE: aching and sharp (medial knee) and deep Pain description: constant but level fluctuates Aggravating factors: worse in the AM Relieving factors: movement but not overdoing, wears knee brace on Rt   PRECAUTIONS: Fall  RED FLAGS: None   WEIGHT BEARING RESTRICTIONS: No  FALLS:  Has patient fallen in last 6 months? Yes. Number of falls 2 - fell down stairs carrying box, fell adjusting shoe on pool deck  LIVING ENVIRONMENT: Lives with: lives with their spouse Lives in: House/apartment Stairs: Yes: Internal: 4 levels steps;   and External: none from garage steps;   Has following equipment at home: None  OCCUPATION: retired  PLOF:  Independent  PATIENT GOALS: reduce pain in hip, improve balance and strength, improve balance confidence  NEXT MD VISIT: 2 weeks  OBJECTIVE:  Note: Objective measures were completed at Evaluation unless otherwise noted.  DIAGNOSTIC FINDINGS:  2025 xrays: Pelvic/Hip IMPRESSION: 1. Mild bilateral hip osteoarthritis, left-greater-than-right. 2. Moderate pubic symphysis osteoarthritis.  Rt knee IMPRESSION: Mild-to-moderate medial compartment and patellofemoral compartment osteoarthritis.  Lumbar: IMPRESSION: Degenerative joint changes of lumbar spine.  PATIENT SURVEYS:  Modified Oswestry 34%   COGNITION: Overall cognitive status: Within functional limits for tasks assessed     SENSATION: WFL  MUSCLE LENGTH:   POSTURE: increased lumbar lordosis  PALPATION: Very tender: Rt side gluteals, QL, greater trochanter  LUMBAR ROM:   AROM eval  Flexion Full, HS stretch end range  Extension Full no pain  Right lateral flexion full  Left lateral flexion 75% compared to Rt flexion, stretch on Rt side  Right rotation WNL, Rt pain  Left rotation WNL   (Blank rows = not tested)  LOWER EXTREMITY ROM:    Bil hips WNL  Passive  Right eval Left eval  Hip flexion    Hip extension    Hip abduction    Hip adduction    Hip internal rotation    Hip external rotation    Knee flexion 125 with pain 125  Knee extension    Ankle dorsiflexion    Ankle plantarflexion    Ankle inversion    Ankle eversion     (Blank rows = not tested)  LOWER EXTREMITY MMT:   4/5 Rt hip abd and ER Rt knee 4/5 with mild pain inhibition   FUNCTIONAL TESTS:  Challenged in SLS balance bil LE Able to squat but unsteady    TREATMENT DATE:  06/17/23: Nustep x 6 min  level 3 (PT present to discuss status) Standing hamstring stretch 3 x  30 sec Standing quad stretch 3 x 30 sec Had patient stand in front of mirror to give visual feedback for LE posture education Lateral band walks with blue loop 3  laps of 15 feet Sit to stand 2 x 10 with emphasis on alignment- educated on neutral foot and proper alignment with sit to stand Seated LAQ 2 x 10 with 4lbs bilaterally (added blue loop for encouraging alignment) Education on alignment and importance of hip strength, proper foot wear and quad strength.   Worked on visualizing posture in the mirror and correcting alignment Discussed use of ice for any discomfort following activity.      06/14/23: Nustep x 6 min level 3 (PT present to discuss status) Sit to stand 2x10 Sidelying clam 2x10 Standing hamstring stretch 2 x 30 sec each Pelvic tilt with ball squeeze 5" hold 2x10 Sit to stand 2x10 Knee to chest 2x20 seconds  Seated piriformis stretch 2 x 30 sec each Trigger Point Dry Needling  Subsequent Treatment: Instructions provided previously at initial dry needling treatment.   Patient Verbal Consent Given: Yes Education Handout Provided: Previously Provided Muscles Treated: Bil lumbar multifidi, Rt gluteals  Electrical Stimulation Performed: No Treatment Response/Outcome: improved tissue mobility and twitch response  Elongation and release to low back and Lt gluteals    06/10/23: Nustep x 7 min level 3 (PT present to discuss status) Verbally reviewed initial HEP  Standing hamstring stretch 2 x 30 sec each Standing quad/hip flexor stretch 2 x 30 sec each Seated piriformis stretch 2 x 30 sec each Trigger Point Dry Needling Initial Treatment: Pt instructed on Dry Needling rational, procedures, and possible side effects. Pt instructed to expect mild to moderate muscle soreness later in the day and/or into the next day.  Pt instructed in methods to reduce muscle soreness. Pt instructed to continue prescribed HEP. Because Dry Needling was performed over or adjacent to a lung field, pt was educated on S/S of pneumothorax and to seek immediate medical attention should they occur.  Patient was educated on signs and symptoms of infection and  other risk factors and advised to seek medical attention should they occur.  Patient verbalized understanding of these instructions and education.  Patient Verbal Consent Given: Yes Education Handout Provided: Previously Provided Muscles Treated: lumbar multifidi, iliocostalis both bilaterally, glut max and minimus, piriformis all right side Electrical Stimulation Performed: No Treatment Response/Outcome: Skilled palpation used to identify taut bands and trigger points.  Once identified, dry needling techniques used to treat these areas.  Twitch response ellicited along with palpable elongation of muscle in the glut area, deep ache response elicited in piriformis.  Following treatment, patient reported minor soreness.   06/07/23: Discussed evaluation findings, initiated HEP               Made video of sidelying "X" stretch on Pt's phone for Rt QL stretch - not in [employer] Introduced education about DN and aquatic exercise program - handsouts given  Prone manual traction S1 away from L5 top-down approach  PATIENT EDUCATION:  Education details: [id number], aquatic info and DN info given Person educated: Patient Education method: Explanation, Demonstration, Verbal cues, and Handouts Education comprehension: verbalized understanding and returned demonstration  HOME EXERCISE PROGRAM: Access Code: [email or web address] URL: [email or web address] Date: 06/17/2023 Prepared by: Aletha Anderson  Exercises - Supine Posterior Pelvic Tilt  - 1 x daily - 7 x weekly - 1 sets - 10 reps - Hooklying Single Knee to Chest Stretch  - 1 x daily - 7 x weekly - 1 sets - 10 reps - Supine Lower Trunk Rotation  - 1 x daily - 7 x weekly - 1 sets - 10 reps - Supine Double Knee to Chest  - 1 x daily - 7 x weekly - 1 sets - 10 reps - Seated Quadratus Lumborum Stretch in Chair  - 1 x daily - 7 x weekly - 1 sets - 2  reps - 20-30 hold - Seated Long Arc Quad  - 1 x daily - 7 x weekly - 1 sets - 10 reps - Clamshell  - 2 x daily - 7 x weekly - 2 sets - 10 reps - Sit to Stand Without Arm Support  - 2 x daily - 7 x weekly - 2 sets - 10 reps - Supine Hip Adduction Isometric with Ball  - 1 x daily - 7 x weekly - 2 sets - 10 reps - 5 hold - Side Stepping with Resistance at Ankles  - 1 x daily - 7 x weekly - 3 sets - 10 reps - Standing Clam with Resistance Loop  - 1 x daily - 7 x weekly - 3 sets - 10 reps ASSESSMENT:  CLINICAL IMPRESSION: Izzibella is progressing appropriately.  We focused on hip strength and alignment as this would likely contribute to her back and hip pain.  She has noticeable right knee > left knee valgus with pain in the right knee.  She was able to complete all tasks today with minimal pain but fatigued easily with hip strengthening.  She would benefit from continuing skilled PT to address pain and muscle strength and endurance.     OBJECTIVE IMPAIRMENTS: decreased balance, decreased coordination, decreased mobility, decreased strength, increased muscle spasms, impaired flexibility, improper body mechanics, postural dysfunction, and pain.   ACTIVITY LIMITATIONS: bending, sitting, standing, squatting, sleeping, and stairs  PARTICIPATION LIMITATIONS: cleaning, shopping, and community activity  PERSONAL FACTORS: 1 comorbidity: Hx of Lt meniscus Sx and ongoing Lt heel pain  are also affecting patient's functional outcome.   REHAB POTENTIAL: Excellent  CLINICAL DECISION MAKING: Stable/uncomplicated  EVALUATION COMPLEXITY: Moderate   GOALS: Goals reviewed with patient? Yes  SHORT TERM GOALS: Target date: 07/05/23  Pt will be ind with initial HEP Baseline: Goal status: MET  2.  Pt will demo good alignment and body mechanics for functional squat Baseline:  Goal status: INITIAL  3.  Pt will have full symmetrical Lt SB resulting from improved Rt QL flexibility Baseline:  Goal status:  INITIAL  4. Pt will learn aquatic program over 1-2 visits to perform ind.  Goal Status: INITIAL    LONG TERM GOALS: Target date: 08/02/23  Pt will be ind with advanced HEP Baseline:  Goal status: INITIAL  2.  Pt will improve ODI score to </= 24% to demo improved function Baseline: 17/50, 34% Goal status: INITIAL  3.  Pt will report improved balance confidence in busy environments such as with her grandkids. Baseline:  Goal status: INITIAL  4.  Pt will achieve 5/5 bil LE  strength to improve functional task performance throughout day. Baseline:  Goal status: INITIAL  5.  Pt will report at least 70% reduction of pain in Rt lumbar spine and Rt LE. Baseline:  Goal status: INITIAL    PLAN:  PT FREQUENCY: 2x/week  PT DURATION: 8 weeks  PLANNED INTERVENTIONS: 97110-Therapeutic exercises, 97530- Therapeutic activity, 97112- Neuromuscular re-education, 97535- Self Care, 16109- Manual therapy, [phone]- Aquatic Therapy, [phone]- Electrical stimulation (unattended), [id number]- Ultrasound, [id number]- Traction (mechanical), [id number]- Ionotophoresis 4mg /ml Dexamethasone, Patient/Family education, Balance training, Dry Needling, Joint mobilization, Spinal mobilization, Cryotherapy, and Moist heat.  PLAN FOR NEXT SESSION: Proceed with 1-2 sessions of aquatic to learn a program for when she joins [facility], assess response to DN #2 to glutes, piriformis and lumbar paraspinals, do DN to Rt QL.  Continue strength and endurance tasks.   LE strength, balance, possible ionto to Rt greater trochanter  Bridgette Campus B. Hosanna Betley, PT 06/17/23 12:25 PM [facility] [address], [address] [place], [place] [address] Phone # [phone] Fax [phone]'

## 2023-06-20 ENCOUNTER — Ambulatory Visit

## 2023-06-20 DIAGNOSIS — M5459 Other low back pain: Secondary | ICD-10-CM

## 2023-06-20 DIAGNOSIS — M6281 Muscle weakness (generalized): Secondary | ICD-10-CM

## 2023-06-20 DIAGNOSIS — M25561 Pain in right knee: Secondary | ICD-10-CM

## 2023-06-20 DIAGNOSIS — R252 Cramp and spasm: Secondary | ICD-10-CM

## 2023-06-20 DIAGNOSIS — R262 Difficulty in walking, not elsewhere classified: Secondary | ICD-10-CM

## 2023-06-20 DIAGNOSIS — M25551 Pain in right hip: Secondary | ICD-10-CM

## 2023-06-20 NOTE — Therapy (Signed)
 OUTPATIENT PHYSICAL THERAPY THORACOLUMBAR TREATMENT NOTE   Patient Name: Jenna Caldwell MRN: [id number] DOB:09/10/1955, 68 y.o., female Today's Date: 06/20/2023  END OF SESSION:  PT End of Session - 06/20/23 1230     Visit Number 5    Date for PT Re-Evaluation 08/02/23    Authorization Type Medicare part A/B    Progress Note Due on Visit 10    PT Start Time 1146    PT Stop Time 1229    PT Time Calculation (min) 43 min    Activity Tolerance Patient tolerated treatment well    Behavior During Therapy WFL for tasks assessed/performed               Past Medical History:  Diagnosis Date   Allergy    Sulfa and metformin severe reaction   Anxiety    Arthritis    Depression    Diabetes mellitus without complication (HCC) 2003   Pre diabetes   Hypertension    Osteopenia    Renal disease    Stage 3B CKD   Past Surgical History:  Procedure Laterality Date   ABLATION     Endometrial   APPENDECTOMY  1967   In sixth gradw   DILATION AND CURETTAGE OF UTERUS     KNEE SURGERY Left    Menisus   MOUTH SURGERY     08/2011   TONSILLECTOMY     Patient Active Problem List   Diagnosis Date Noted   CKD (chronic kidney disease) stage 3, GFR 30-59 ml/min (HCC) 05/25/2023   Anxiety and depression 12/10/2022   Primary hypertension 12/10/2022    PCP: Rebecca Campus, NP  REFERRING PROVIDER: Syliva Even, MD  REFERRING DIAG: [id number] (ICD-10-CM) - Chronic pain of right knee M25.551 (ICD-10-CM) - Right hip pain M54.41,G89.29 (ICD-10-CM) - Chronic right-sided low back pain with right-sided sciatica  Rationale for Evaluation and Treatment: Rehabilitation  THERAPY DIAG:  Muscle weakness (generalized)  Other low back pain  Cramp and spasm  Pain in right hip  Difficulty in walking, not elsewhere classified  Acute pain of right knee  ONSET DATE: moved 5 mos ago and symptoms flared up - fell down steps when carrying items  SUBJECTIVE:                                                                                                                                                                                            SUBJECTIVE STATEMENT: My Rt back is 75% better overall.  My Rt knee is still bothering me.  I did a lot of yardwork and it was more sore and I wore the brace.  Initial eval: Pt moved 5mos ago and during packing process fell down steps which started Rt hip pain.  Also traveled in new RV to [place] which exacerbated pain and had another fall (backwards) onto Rt hip. Have been in RV for several months.  Tight spaces make for difficult getting around at times.  This all made me feel like maybe I need some PT.  I am realizing I am better when I am up and moving. Will be here in [place] until later in the summer when we will go out on our RV again.  Pain starts in Rt low back in a specific place and travels down the leg (Pt outlines the L4 nerve root pattern). Worse in the AM and better with movement but yet I can overdo it. I am afraid of falling - my knees are shaky and didn't used to be.  PERTINENT HISTORY:  Lt meniscus surgery 2008  PAIN:  PAIN:   06/20/23 Are you having pain? Yes NPRS scale: unsure but never a /10 Pain location: Rt SI region and wraps to lateral hip, anterior thigh, ant/medial leg Pain orientation: Right  PAIN TYPE: aching and sharp (medial knee) and deep Pain description: constant but level fluctuates Aggravating factors: worse in the AM Relieving factors: movement but not overdoing, wears knee brace on Rt   PRECAUTIONS: Fall  RED FLAGS: None   WEIGHT BEARING RESTRICTIONS: No  FALLS:  Has patient fallen in last 6 months? Yes. Number of falls 2 - fell down stairs carrying box, fell adjusting shoe on pool deck  LIVING ENVIRONMENT: Lives with: lives with their spouse Lives in: House/apartment Stairs: Yes: Internal: 4 levels steps;   and External: none from garage steps;   Has following equipment at home:  None  OCCUPATION: retired  PLOF: Independent  PATIENT GOALS: reduce pain in hip, improve balance and strength, improve balance confidence  NEXT MD VISIT: 2 weeks  OBJECTIVE:  Note: Objective measures were completed at Evaluation unless otherwise noted.  DIAGNOSTIC FINDINGS:  2025 xrays: Pelvic/Hip IMPRESSION: 1. Mild bilateral hip osteoarthritis, left-greater-than-right. 2. Moderate pubic symphysis osteoarthritis.  Rt knee IMPRESSION: Mild-to-moderate medial compartment and patellofemoral compartment osteoarthritis.  Lumbar: IMPRESSION: Degenerative joint changes of lumbar spine.  PATIENT SURVEYS:  Modified Oswestry 34%   COGNITION: Overall cognitive status: Within functional limits for tasks assessed     SENSATION: WFL  MUSCLE LENGTH:   POSTURE: increased lumbar lordosis  PALPATION: Very tender: Rt side gluteals, QL, greater trochanter  LUMBAR ROM:   AROM eval  Flexion Full, HS stretch end range  Extension Full no pain  Right lateral flexion full  Left lateral flexion 75% compared to Rt flexion, stretch on Rt side  Right rotation WNL, Rt pain  Left rotation WNL   (Blank rows = not tested)  LOWER EXTREMITY ROM:    Bil hips WNL  Passive  Right eval Left eval  Hip flexion    Hip extension    Hip abduction    Hip adduction    Hip internal rotation    Hip external rotation    Knee flexion 125 with pain 125  Knee extension    Ankle dorsiflexion    Ankle plantarflexion    Ankle inversion    Ankle eversion     (Blank rows = not tested)  LOWER EXTREMITY MMT:   4/5 Rt hip abd and ER Rt knee 4/5 with mild pain inhibition   FUNCTIONAL TESTS:  Challenged in SLS balance bil LE Able to squat  but unsteady    TREATMENT DATE:   06/20/23: Nustep x 6 min level 5 (PT present to discuss status) Standing hamstring stretch 3 x  30 sec- used power plate  Standing quad stretch 3 x 30 sec Standing on balance pad: hip abduction 2x10 bil  Sit to stand 2 x  10 with emphasis on alignment- educated on neutral foot and proper alignment with sit to stand Trigger Point Dry Needling  Subsequent Treatment: Instructions provided previously at initial dry needling treatment.   Patient Verbal Consent Given: Yes Education Handout Provided: Previously Provided Muscles Treated: Bil lumbar multifidi, Rt  and Lt gluteals  Electrical Stimulation Performed: No Treatment Response/Outcome: Utilized skilled palpation to identify bony landmarks and trigger points.  Able to illicit twitch response and muscle elongation.  Soft tissue mobilization to muscles needled following DN to further promote tissue elongation and decreased pain.     Elongation and release to low back and Lt/Rt gluteals   06/17/23: Nustep x 6 min level 3 (PT present to discuss status) Standing hamstring stretch 3 x  30 sec Standing quad stretch 3 x 30 sec Had patient stand in front of mirror to give visual feedback for LE posture education Lateral band walks with blue loop 3 laps of 15 feet Sit to stand 2 x 10 with emphasis on alignment- educated on neutral foot and proper alignment with sit to stand Seated LAQ 2 x 10 with 4lbs bilaterally (added blue loop for encouraging alignment) Education on alignment and importance of hip strength, proper foot wear and quad strength.   Worked on visualizing posture in the mirror and correcting alignment Discussed use of ice for any discomfort following activity.      06/14/23: Nustep x 6 min level 3 (PT present to discuss status) Sit to stand 2x10 Sidelying clam 2x10 Standing hamstring stretch 2 x 30 sec each Pelvic tilt with ball squeeze 5" hold 2x10 Sit to stand 2x10 Knee to chest 2x20 seconds  Seated piriformis stretch 2 x 30 sec each Trigger Point Dry Needling  Subsequent Treatment: Instructions provided previously at initial dry needling treatment.   Patient Verbal Consent Given: Yes Education Handout Provided: Previously Provided Muscles  Treated: Bil lumbar multifidi, Rt gluteals  Electrical Stimulation Performed: No Treatment Response/Outcome: improved tissue mobility and twitch response  Elongation and release to low back and Lt gluteals    PATIENT EDUCATION:  Education details: [id number], aquatic info and DN info given Person educated: Patient Education method: Explanation, Demonstration, Verbal cues, and Handouts Education comprehension: verbalized understanding and returned demonstration  HOME EXERCISE PROGRAM: Access Code: [email or web address] URL: [email or web address] Date: 06/17/2023 Prepared by: Aletha Anderson  Exercises - Supine Posterior Pelvic Tilt  - 1 x daily - 7 x weekly - 1 sets - 10 reps - Hooklying Single Knee to Chest Stretch  - 1 x daily - 7 x weekly - 1 sets - 10 reps - Supine Lower Trunk Rotation  - 1 x daily - 7 x weekly - 1 sets - 10 reps - Supine Double Knee to Chest  - 1 x daily - 7 x weekly - 1 sets - 10 reps - Seated Quadratus Lumborum Stretch in Chair  - 1 x daily - 7 x weekly - 1 sets - 2 reps - 20-30 hold - Seated Long Arc Quad  - 1 x daily - 7 x weekly - 1 sets - 10 reps - Clamshell  - 2 x daily - 7 x weekly - 2 sets - 10 reps -  Sit to Stand Without Arm Support  - 2 x daily - 7 x weekly - 2 sets - 10 reps - Supine Hip Adduction Isometric with Ball  - 1 x daily - 7 x weekly - 2 sets - 10 reps - 5 hold - Side Stepping with Resistance at Ankles  - 1 x daily - 7 x weekly - 3 sets - 10 reps - Standing Clam with Resistance Loop  - 1 x daily - 7 x weekly - 3 sets - 10 reps ASSESSMENT:  CLINICAL IMPRESSION: Pt reports 75% reduction in Rt LE and back pain since the start of care.  Pt reports that her Rt medial knee has been painful and she has been active with yardwork and feels she might have done too much.  We focused on hip strength and alignment as this would likely contribute to her back and hip pain.   She was able to complete all tasks today with minimal pain but fatigued easily with  hip strengthening.  She would benefit from continuing skilled PT to address pain and muscle strength and endurance.     OBJECTIVE IMPAIRMENTS: decreased balance, decreased coordination, decreased mobility, decreased strength, increased muscle spasms, impaired flexibility, improper body mechanics, postural dysfunction, and pain.   ACTIVITY LIMITATIONS: bending, sitting, standing, squatting, sleeping, and stairs  PARTICIPATION LIMITATIONS: cleaning, shopping, and community activity  PERSONAL FACTORS: 1 comorbidity: Hx of Lt meniscus Sx and ongoing Lt heel pain  are also affecting patient's functional outcome.   REHAB POTENTIAL: Excellent  CLINICAL DECISION MAKING: Stable/uncomplicated  EVALUATION COMPLEXITY: Moderate   GOALS: Goals reviewed with patient? Yes  SHORT TERM GOALS: Target date: 07/05/23  Pt will be ind with initial HEP Baseline: Goal status: MET  2.  Pt will demo good alignment and body mechanics for functional squat Baseline:  Goal status: INITIAL  3.  Pt will have full symmetrical Lt SB resulting from improved Rt QL flexibility Baseline:  Goal status: INITIAL  4. Pt will learn aquatic program over 1-2 visits to perform ind.  Goal Status: INITIAL    LONG TERM GOALS: Target date: 08/02/23  Pt will be ind with advanced HEP Baseline:  Goal status: INITIAL  2.  Pt will improve ODI score to </= 24% to demo improved function Baseline: 17/50, 34% Goal status: INITIAL  3.  Pt will report improved balance confidence in busy environments such as with her grandkids. Baseline:  Goal status: INITIAL  4.  Pt will achieve 5/5 bil LE strength to improve functional task performance throughout day. Baseline:  Goal status: INITIAL  5.  Pt will report at least 70% reduction of pain in Rt lumbar spine and Rt LE. Baseline:  Goal status: INITIAL    PLAN:  PT FREQUENCY: 2x/week  PT DURATION: 8 weeks  PLANNED INTERVENTIONS: 97110-Therapeutic exercises, 97530-  Therapeutic activity, 97112- Neuromuscular re-education, 97535- Self Care, 16109- Manual therapy, [phone]- Aquatic Therapy, [phone]- Electrical stimulation (unattended), [id number]- Ultrasound, [id number]- Traction (mechanical), [id number]- Ionotophoresis 4mg /ml Dexamethasone, Patient/Family education, Balance training, Dry Needling, Joint mobilization, Spinal mobilization, Cryotherapy, and Moist heat.  PLAN FOR NEXT SESSION: Proceed with 1-2 sessions of aquatic to learn a program for when she joins [facility], assess response to DN #3 to glutes, piriformis and lumbar paraspinals. Continue strength and endurance tasks.   LE strength, balance and work on alignment.   Luella Sager, PT 06/20/23 12:31 PM  [facility] [address] [place], [place] [address] Phone # [phone] Fax [phone]'

## 2023-06-22 ENCOUNTER — Ambulatory Visit

## 2023-06-22 DIAGNOSIS — R262 Difficulty in walking, not elsewhere classified: Secondary | ICD-10-CM

## 2023-06-22 DIAGNOSIS — M6281 Muscle weakness (generalized): Secondary | ICD-10-CM

## 2023-06-22 DIAGNOSIS — R252 Cramp and spasm: Secondary | ICD-10-CM

## 2023-06-22 DIAGNOSIS — M25551 Pain in right hip: Secondary | ICD-10-CM

## 2023-06-22 DIAGNOSIS — M5459 Other low back pain: Secondary | ICD-10-CM

## 2023-06-22 DIAGNOSIS — M25561 Pain in right knee: Secondary | ICD-10-CM

## 2023-06-22 NOTE — Therapy (Signed)
 OUTPATIENT PHYSICAL THERAPY THORACOLUMBAR TREATMENT NOTE   Patient Name: Jenna Caldwell MRN: [id number] DOB:09/19/1955, 68 y.o., female Today's Date: 06/22/2023  END OF SESSION:  PT End of Session - 06/22/23 1320     Visit Number 6    Date for PT Re-Evaluation 08/02/23    Authorization Type Medicare part A/B    Progress Note Due on Visit 10    PT Start Time 1233    PT Stop Time 1315    PT Time Calculation (min) 42 min    Activity Tolerance Patient tolerated treatment well    Behavior During Therapy WFL for tasks assessed/performed                Past Medical History:  Diagnosis Date   Allergy    Sulfa and metformin severe reaction   Anxiety    Arthritis    Depression    Diabetes mellitus without complication (HCC) 2003   Pre diabetes   Hypertension    Osteopenia    Renal disease    Stage 3B CKD   Past Surgical History:  Procedure Laterality Date   ABLATION     Endometrial   APPENDECTOMY  1967   In sixth gradw   DILATION AND CURETTAGE OF UTERUS     KNEE SURGERY Left    Menisus   MOUTH SURGERY     08/2011   TONSILLECTOMY     Patient Active Problem List   Diagnosis Date Noted   CKD (chronic kidney disease) stage 3, GFR 30-59 ml/min (HCC) 05/25/2023   Anxiety and depression 12/10/2022   Primary hypertension 12/10/2022    PCP: Rebecca Campus, NP  REFERRING PROVIDER: Syliva Even, MD  REFERRING DIAG: [phone] (ICD-10-CM) - Chronic pain of right knee M25.551 (ICD-10-CM) - Right hip pain M54.41,G89.29 (ICD-10-CM) - Chronic right-sided low back pain with right-sided sciatica  Rationale for Evaluation and Treatment: Rehabilitation  THERAPY DIAG:  Muscle weakness (generalized)  Cramp and spasm  Other low back pain  Pain in right hip  Difficulty in walking, not elsewhere classified  Acute pain of right knee  ONSET DATE: moved 5 mos ago and symptoms flared up - fell down steps when carrying items  SUBJECTIVE:                                                                                                                                                                                            SUBJECTIVE STATEMENT: [place] and I had a flare-up of Rt LE pain last night and this morning.  It extended to my Rt achilles.  I feel better now.  Initial eval: Pt moved 5mos ago and during packing process fell down steps which started Rt hip pain.  Also traveled in new RV to [place] which exacerbated pain and had another fall (backwards) onto Rt hip. Have been in RV for several months.  Tight spaces make for difficult getting around at times.  This all made me feel like maybe I need some PT.  I am realizing I am better when I am up and moving. Will be here in [place] until later in the summer when we will go out on our RV again.  Pain starts in Rt low back in a specific place and travels down the leg (Pt outlines the L4 nerve root pattern). Worse in the AM and better with movement but yet I can overdo it. I am afraid of falling - my knees are shaky and didn't used to be.  PERTINENT HISTORY:  Lt meniscus surgery 2008  PAIN:  PAIN:   06/22/23 Are you having pain? Yes NPRS scale: 0-9/10 Pain location: Rt SI region and wraps to lateral hip, anterior thigh, ant/medial leg Pain orientation: Right  PAIN TYPE: aching and sharp (medial knee) and deep Pain description: constant but level fluctuates Aggravating factors: worse in the AM Relieving factors: movement but not overdoing, wears knee brace on Rt   PRECAUTIONS: Fall  RED FLAGS: None   WEIGHT BEARING RESTRICTIONS: No  FALLS:  Has patient fallen in last 6 months? Yes. Number of falls 2 - fell down stairs carrying box, fell adjusting shoe on pool deck  LIVING ENVIRONMENT: Lives with: lives with their spouse Lives in: House/apartment Stairs: Yes: Internal: 4 levels steps;   and External: none from garage steps;   Has following equipment at  home: None  OCCUPATION: retired  PLOF: Independent  PATIENT GOALS: reduce pain in hip, improve balance and strength, improve balance confidence  NEXT MD VISIT: 2 weeks  OBJECTIVE:  Note: Objective measures were completed at Evaluation unless otherwise noted.  DIAGNOSTIC FINDINGS:  2025 xrays: Pelvic/Hip IMPRESSION: 1. Mild bilateral hip osteoarthritis, left-greater-than-right. 2. Moderate pubic symphysis osteoarthritis.  Rt knee IMPRESSION: Mild-to-moderate medial compartment and patellofemoral compartment osteoarthritis.  Lumbar: IMPRESSION: Degenerative joint changes of lumbar spine.  PATIENT SURVEYS:  Modified Oswestry 34%   COGNITION: Overall cognitive status: Within functional limits for tasks assessed     SENSATION: WFL  MUSCLE LENGTH:   POSTURE: increased lumbar lordosis  PALPATION: Very tender: Rt side gluteals, QL, greater trochanter  LUMBAR ROM:   AROM eval  Flexion Full, HS stretch end range  Extension Full no pain  Right lateral flexion full  Left lateral flexion 75% compared to Rt flexion, stretch on Rt side  Right rotation WNL, Rt pain  Left rotation WNL   (Blank rows = not tested)  LOWER EXTREMITY ROM:    Bil hips WNL  Passive  Right eval Left eval  Hip flexion    Hip extension    Hip abduction    Hip adduction    Hip internal rotation    Hip external rotation    Knee flexion 125 with pain 125  Knee extension    Ankle dorsiflexion    Ankle plantarflexion    Ankle inversion    Ankle eversion     (Blank rows = not tested)  LOWER EXTREMITY MMT:   4/5 Rt hip abd and ER Rt knee 4/5 with mild pain inhibition   FUNCTIONAL TESTS:  Challenged in SLS balance bil LE Able to squat but unsteady  TREATMENT DATE:    06/22/23: Nustep x 6 min level 5 (PT present to discuss status) Standing rockerboard x3 Gastroc stretch at wall and on steps  Work on step down 6" with neutral hip and knee alignment x15 Standing hamstring  stretch 3 x  30 sec- used power plate  Standing quad stretch 3 x 30 sec- used power plate  Standing on balance pad: hip abduction 2x10 bil  Sit to stand 2 x 10 with emphasis on alignment- educated on neutral foot and proper alignment with sit to stand  06/20/23: Nustep x 6 min level 5 (PT present to discuss status) Standing hamstring stretch 3 x  30 sec- used power plate  Standing quad stretch 3 x 30 sec Standing on balance pad: hip abduction 2x10 bil  Sit to stand 2 x 10 with emphasis on alignment- educated on neutral foot and proper alignment with sit to stand Trigger Point Dry Needling  Subsequent Treatment: Instructions provided previously at initial dry needling treatment.   Patient Verbal Consent Given: Yes Education Handout Provided: Previously Provided Muscles Treated: Bil lumbar multifidi, Rt  and Lt gluteals  Electrical Stimulation Performed: No Treatment Response/Outcome: Utilized skilled palpation to identify bony landmarks and trigger points.  Able to illicit twitch response and muscle elongation.  Soft tissue mobilization to muscles needled following DN to further promote tissue elongation and decreased pain.     Elongation and release to low back and Lt/Rt gluteals   06/17/23: Nustep x 6 min level 3 (PT present to discuss status) Standing hamstring stretch 3 x  30 sec Standing quad stretch 3 x 30 sec Had patient stand in front of mirror to give visual feedback for LE posture education Lateral band walks with blue loop 3 laps of 15 feet Sit to stand 2 x 10 with emphasis on alignment- educated on neutral foot and proper alignment with sit to stand Seated LAQ 2 x 10 with 4lbs bilaterally (added blue loop for encouraging alignment) Education on alignment and importance of hip strength, proper foot wear and quad strength.   Worked on visualizing posture in the mirror and correcting alignment Discussed use of ice for any discomfort following activity.        PATIENT  EDUCATION:  Education details: [id number], aquatic info and DN info given Person educated: Patient Education method: Explanation, Demonstration, Verbal cues, and Handouts Education comprehension: verbalized understanding and returned demonstration  HOME EXERCISE PROGRAM: Access Code: [email or web address] URL: [email or web address] Date: 06/17/2023 Prepared by: Aletha Anderson  Exercises - Supine Posterior Pelvic Tilt  - 1 x daily - 7 x weekly - 1 sets - 10 reps - Hooklying Single Knee to Chest Stretch  - 1 x daily - 7 x weekly - 1 sets - 10 reps - Supine Lower Trunk Rotation  - 1 x daily - 7 x weekly - 1 sets - 10 reps - Supine Double Knee to Chest  - 1 x daily - 7 x weekly - 1 sets - 10 reps - Seated Quadratus Lumborum Stretch in Chair  - 1 x daily - 7 x weekly - 1 sets - 2 reps - 20-30 hold - Seated Long Arc Quad  - 1 x daily - 7 x weekly - 1 sets - 10 reps - Clamshell  - 2 x daily - 7 x weekly - 2 sets - 10 reps - Sit to Stand Without Arm Support  - 2 x daily - 7 x weekly - 2 sets - 10 reps - Supine Hip Adduction  Isometric with Ball  - 1 x daily - 7 x weekly - 2 sets - 10 reps - 5 hold - Side Stepping with Resistance at Ankles  - 1 x daily - 7 x weekly - 3 sets - 10 reps - Standing Clam with Resistance Loop  - 1 x daily - 7 x weekly - 3 sets - 10 reps ASSESSMENT:  CLINICAL IMPRESSION: Pt reports a flare-up of Rt UE pain after walking [place] the science center yesterday.  Pt with tension and reduced A/ROM at bil achilles and we focused on stretching this region today.  In addition, pt mentioned that she walks down the steps sideways using step by step gait since her meniscus surgery in 2019.  We discussed how to normalize this pattern and how to practice to improve negotiating steps with more symmetry and reduced use of arms.  She would benefit from continuing skilled PT to address pain and muscle strength and endurance.     OBJECTIVE IMPAIRMENTS: decreased balance, decreased  coordination, decreased mobility, decreased strength, increased muscle spasms, impaired flexibility, improper body mechanics, postural dysfunction, and pain.   ACTIVITY LIMITATIONS: bending, sitting, standing, squatting, sleeping, and stairs  PARTICIPATION LIMITATIONS: cleaning, shopping, and community activity  PERSONAL FACTORS: 1 comorbidity: Hx of Lt meniscus Sx and ongoing Lt heel pain  are also affecting patient's functional outcome.   REHAB POTENTIAL: Excellent  CLINICAL DECISION MAKING: Stable/uncomplicated  EVALUATION COMPLEXITY: Moderate   GOALS: Goals reviewed with patient? Yes  SHORT TERM GOALS: Target date: 07/05/23  Pt will be ind with initial HEP Baseline: Goal status: MET  2.  Pt will demo good alignment and body mechanics for functional squat Baseline:  Goal status: INITIAL  3.  Pt will have full symmetrical Lt SB resulting from improved Rt QL flexibility Baseline:  Goal status: INITIAL  4. Pt will learn aquatic program over 1-2 visits to perform ind.  Goal Status: INITIAL    LONG TERM GOALS: Target date: 08/02/23  Pt will be ind with advanced HEP Baseline:  Goal status: INITIAL  2.  Pt will improve ODI score to </= 24% to demo improved function Baseline: 17/50, 34% Goal status: INITIAL  3.  Pt will report improved balance confidence in busy environments such as with her grandkids. Baseline:  Goal status: INITIAL  4.  Pt will achieve 5/5 bil LE strength to improve functional task performance throughout day. Baseline:  Goal status: INITIAL  5.  Pt will report at least 70% reduction of pain in Rt lumbar spine and Rt LE. Baseline:  Goal status: INITIAL    PLAN:  PT FREQUENCY: 2x/week  PT DURATION: 8 weeks  PLANNED INTERVENTIONS: 97110-Therapeutic exercises, 97530- Therapeutic activity, 97112- Neuromuscular re-education, 97535- Self Care, 40981- Manual therapy, [phone]- Aquatic Therapy, [phone]- Electrical stimulation (unattended), [id number]-  Ultrasound, [id number]- Traction (mechanical), [id number]- Ionotophoresis 4mg /ml Dexamethasone, Patient/Family education, Balance training, Dry Needling, Joint mobilization, Spinal mobilization, Cryotherapy, and Moist heat.  PLAN FOR NEXT SESSION: Proceed with 1-2 sessions of aquatic to learn a program for when she joins [facility],  Continue strength and endurance tasks.   LE strength, balance and work on alignment.   Luella Sager, PT 06/22/23 1:21 PM  [facility] [address], [address] [place], [place] [address] Phone # [phone] Fax [phone]'

## 2023-06-24 ENCOUNTER — Ambulatory Visit (INDEPENDENT_AMBULATORY_CARE_PROVIDER_SITE_OTHER): Admitting: Family Medicine

## 2023-06-24 ENCOUNTER — Other Ambulatory Visit: Payer: Self-pay

## 2023-06-24 VITALS — BP 142/88 | HR 82 | Ht 63.5 in | Wt 184.0 lb

## 2023-06-24 DIAGNOSIS — M7662 Achilles tendinitis, left leg: Secondary | ICD-10-CM | POA: Insufficient documentation

## 2023-06-24 DIAGNOSIS — M25561 Pain in right knee: Secondary | ICD-10-CM

## 2023-06-24 DIAGNOSIS — G8929 Other chronic pain: Secondary | ICD-10-CM | POA: Insufficient documentation

## 2023-06-24 MED ORDER — DIAZEPAM 5 MG PO TABS: ORAL_TABLET | ORAL | 0 refills | Status: DC

## 2023-06-24 NOTE — Patient Instructions (Addendum)
 Thank you for coming in today.   You should hear from MRI scheduling within 1 week. If you do not hear please let me know.    You will need a driver after valium for anxiety with the MRI.   After we get results if you have a clear surgical problem I often will refer directly to the surgeon.

## 2023-06-24 NOTE — Progress Notes (Signed)
 [name], PhD, LAT, ATC acting as a scribe for [name], MD.  Jenna Caldwell is a 68 y.o. female who presents to [facility] today for 98-month f/u R-sided LBP, extending to the R lateral hip/buttocks, and into the R knee. Pt was last seen by Dr. [name] on 05/25/23 and was advised on Tylenol dosing and prescribed tizanidine . Also referred to PT, completing 6 visits.  Today, pt reports she has been working w/ PT and doing HEP. Dry needling has been helpful. R knee is feeling a bit better, but still painful. She has modified how she goes down the stairs, which she relates to her pain. Pt also notes retro-calcaneal nodule.   Dx imaging: 05/25/23 L-spine, R knee, & R hip XR  Pertinent review of systems: No fevers or chills  Relevant historical information: Hypertension and CKD.   Exam:  BP (!) 142/88   Pulse 82   Ht 5' 3.5" (1.613 m)   Wt 184 lb (83.5 kg)   SpO2 98%   BMI 32.08 kg/m  General: Well Developed, well nourished, and in no acute distress.   MSK: Right knee: Normal-appearing Normal motion with crepitation.  Tender palpation medial joint line. Positive McMurray's test.  Left heel bony prominence posterior calcaneus consistent with heel spur or Haglund deformity.  Mildly tender to palpation.    Lab and Radiology Results  EXAM: RIGHT KNEE 3 VIEWS   COMPARISON:  None Available.   FINDINGS: Mild-to-moderate medial compartment joint space narrowing with mild peripheral osteophytosis. Moderate superior and lateral patellar degenerative spurring. Tiny joint effusion. No acute fracture is seen. No dislocation.   IMPRESSION: Mild-to-moderate medial compartment and patellofemoral compartment osteoarthritis.     Electronically Signed   By: Bertina Broccoli M.D.   On: 06/04/2023 17:42   I, [name], personally (independently) visualized and performed the interpretation of the images attached in this note.     Assessment and  Plan: 68 y.o. female with persistent chronic right knee pain not improving with physical therapy.  Concern for degenerative meniscus tear to explain the worsening pain.  She only has mild appearing arthritis on x-ray.  Plan for MRI to further characterize source of pain and for treatment planning including possible future injection or surgery.  Left posterior heel pain due to chronic calcific Achilles tendinitis.  She is already had some physical therapy which should continue to help.  Continue PT.  Valium prescribed for MRI claustrophobia.   PDMP reviewed during this encounter. Orders Placed This Encounter  Procedures   US  LIMITED JOINT SPACE STRUCTURES LOW RIGHT(NO LINKED CHARGES)    Reason for Exam (SYMPTOM  OR DIAGNOSIS REQUIRED):   right knee pain    Preferred imaging location?:   [facility]   MR Knee Right Wo Contrast    Standing Status:   Future    Expiration Date:   06/23/2024    What is the patient's sedation requirement?:   Anti-anxiety    Does the patient have a pacemaker or implanted devices?:   No    Preferred imaging location?:   [facility] (table limit-550lbs)   Meds ordered this encounter  Medications   diazepam (VALIUM) 5 MG tablet    Sig: Take 1 tab PO 1 hour before procedure or imaging.    Dispense:  2 tablet    Refill:  0     Discussed warning signs or symptoms. Please see discharge instructions. Patient expresses understanding.   The  above documentation has been reviewed and is accurate and complete [name], M.D.

## 2023-06-27 ENCOUNTER — Ambulatory Visit

## 2023-06-27 DIAGNOSIS — M6281 Muscle weakness (generalized): Secondary | ICD-10-CM | POA: Diagnosis not present

## 2023-06-27 DIAGNOSIS — R262 Difficulty in walking, not elsewhere classified: Secondary | ICD-10-CM

## 2023-06-27 DIAGNOSIS — M25551 Pain in right hip: Secondary | ICD-10-CM

## 2023-06-27 DIAGNOSIS — M5459 Other low back pain: Secondary | ICD-10-CM

## 2023-06-27 DIAGNOSIS — R252 Cramp and spasm: Secondary | ICD-10-CM

## 2023-06-27 NOTE — Therapy (Signed)
 OUTPATIENT PHYSICAL THERAPY THORACOLUMBAR TREATMENT NOTE   Patient Name: Jenna Caldwell MRN: [id number] DOB:21-May-1955, 68 y.o., female Today's Date: 06/27/2023  END OF SESSION:  PT End of Session - 06/27/23 1318     Visit Number 7    Date for PT Re-Evaluation 08/02/23    Authorization Type Medicare part A/B    Progress Note Due on Visit 10    PT Start Time 1231    PT Stop Time 1317    PT Time Calculation (min) 46 min    Activity Tolerance Patient tolerated treatment well    Behavior During Therapy WFL for tasks assessed/performed                 Past Medical History:  Diagnosis Date   Allergy    Sulfa and metformin severe reaction   Anxiety    Arthritis    Depression    Diabetes mellitus without complication (HCC) 2003   Pre diabetes   Hypertension    Osteopenia    Renal disease    Stage 3B CKD   Past Surgical History:  Procedure Laterality Date   ABLATION     Endometrial   APPENDECTOMY  1967   In sixth gradw   DILATION AND CURETTAGE OF UTERUS     KNEE SURGERY Left    Menisus   MOUTH SURGERY     08/2011   TONSILLECTOMY     Patient Active Problem List   Diagnosis Date Noted   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 06/24/2023   Chronic pain of right knee 06/24/2023   CKD (chronic kidney disease) stage 3, GFR 30-59 ml/min (HCC) 05/25/2023   Anxiety and depression 12/10/2022   Primary hypertension 12/10/2022    PCP: Rebecca Campus, NP  REFERRING PROVIDER: Syliva Even, MD  REFERRING DIAG: [id number] (ICD-10-CM) - Chronic pain of right knee M25.551 (ICD-10-CM) - Right hip pain M54.41,G89.29 (ICD-10-CM) - Chronic right-sided low back pain with right-sided sciatica  Rationale for Evaluation and Treatment: Rehabilitation  THERAPY DIAG:  Muscle weakness (generalized)  Cramp and spasm  Other low back pain  Pain in right hip  Difficulty in walking, not elsewhere classified  ONSET DATE: moved 5 mos ago and symptoms flared up  - fell down steps when carrying items  SUBJECTIVE:                                                                                                                                                                                           SUBJECTIVE STATEMENT: My Rt hip is much better.  I am 70-80% better since the start of care.  I have some  flare-ups intermittently. I have an MRI next week.  I have been working on steps and doing with less substitution.   Initial eval: Pt moved 5mos ago and during packing process fell down steps which started Rt hip pain.  Also traveled in new RV to [place] which exacerbated pain and had another fall (backwards) onto Rt hip. Have been in RV for several months.  Tight spaces make for difficult getting around at times.  This all made me feel like maybe I need some PT.  I am realizing I am better when I am up and moving. Will be here in [place] until later in the summer when we will go out on our RV again.  Pain starts in Rt low back in a specific place and travels down the leg (Pt outlines the L4 nerve root pattern). Worse in the AM and better with movement but yet I can overdo it. I am afraid of falling - my knees are shaky and didn't used to be.  PERTINENT HISTORY:  Lt meniscus surgery 2008  PAIN:  PAIN:  06/27/23 Are you having pain? Yes NPRS scale: 0-9/10 Pain location: Rt SI region and wraps to lateral hip, anterior thigh, ant/medial leg Pain orientation: Right  PAIN TYPE: aching and sharp (medial knee) and deep Pain description: constant but level fluctuates Aggravating factors: worse in the AM Relieving factors: movement but not overdoing, wears knee brace on Rt   PRECAUTIONS: Fall  RED FLAGS: None   WEIGHT BEARING RESTRICTIONS: No  FALLS:  Has patient fallen in last 6 months? Yes. Number of falls 2 - fell down stairs carrying box, fell adjusting shoe on pool deck  LIVING ENVIRONMENT: Lives with: lives with their spouse Lives in:  House/apartment Stairs: Yes: Internal: 4 levels steps;   and External: none from garage steps;   Has following equipment at home: None  OCCUPATION: retired  PLOF: Independent  PATIENT GOALS: reduce pain in hip, improve balance and strength, improve balance confidence  NEXT MD VISIT: 2 weeks  OBJECTIVE:  Note: Objective measures were completed at Evaluation unless otherwise noted.  DIAGNOSTIC FINDINGS:  2025 xrays: Pelvic/Hip IMPRESSION: 1. Mild bilateral hip osteoarthritis, left-greater-than-right. 2. Moderate pubic symphysis osteoarthritis.  Rt knee IMPRESSION: Mild-to-moderate medial compartment and patellofemoral compartment osteoarthritis.  Lumbar: IMPRESSION: Degenerative joint changes of lumbar spine.  PATIENT SURVEYS:  Modified Oswestry 34%   COGNITION: Overall cognitive status: Within functional limits for tasks assessed     SENSATION: WFL  MUSCLE LENGTH:   POSTURE: increased lumbar lordosis  PALPATION: Very tender: Rt side gluteals, QL, greater trochanter  LUMBAR ROM:   AROM eval  Flexion Full, HS stretch end range  Extension Full no pain  Right lateral flexion full  Left lateral flexion 75% compared to Rt flexion, stretch on Rt side  Right rotation WNL, Rt pain  Left rotation WNL   (Blank rows = not tested)  LOWER EXTREMITY ROM:    Bil hips WNL  Passive  Right eval Left eval  Hip flexion    Hip extension    Hip abduction    Hip adduction    Hip internal rotation    Hip external rotation    Knee flexion 125 with pain 125  Knee extension    Ankle dorsiflexion    Ankle plantarflexion    Ankle inversion    Ankle eversion     (Blank rows = not tested)  LOWER EXTREMITY MMT:   4/5 Rt hip abd and ER Rt knee 4/5 with  mild pain inhibition   FUNCTIONAL TESTS:  Challenged in SLS balance bil LE Able to squat but unsteady   TREATMENT DATE:   06/27/23: Nustep x 6 min level 5 (PT present to discuss status) Standing rockerboard x2  min Up and down steps with forward facing posture, step-over step with use of rails Standing hamstring stretch 3 x  30 sec- used power plate  Sit to stand with 5# kettlebell; parallel feet and staggered stance x10 each  Rockerboard against wall- gastroc stretch  Aquatic info issued  Trigger Point Dry Needling Subsequent Treatment: Instructions provided previously at initial dry needling treatment.   Patient Verbal Consent Given: Yes Education Handout Provided: Previously Provided Muscles Treated: Bil lumbar multifidi, Rt  and Lt gluteals  Electrical Stimulation Performed: No Treatment Response/Outcome: Utilized skilled palpation to identify bony landmarks and trigger points.  Able to illicit twitch response and muscle elongation.  Soft tissue mobilization to muscles needled following DN to further promote tissue elongation and decreased pain.      06/22/23: Nustep x 6 min level 5 (PT present to discuss status) Standing rockerboard x3 Gastroc stretch at wall and on steps  Work on step down 6" with neutral hip and knee alignment x15 Standing hamstring stretch 3 x  30 sec- used power plate  Standing quad stretch 3 x 30 sec- used power plate  Standing on balance pad: hip abduction 2x10 bil  Sit to stand 2 x 10 with emphasis on alignment- educated on neutral foot and proper alignment with sit to stand  06/20/23: Nustep x 6 min level 5 (PT present to discuss status) Standing hamstring stretch 3 x  30 sec- used power plate  Standing quad stretch 3 x 30 sec Standing on balance pad: hip abduction 2x10 bil  Sit to stand 2 x 10 with emphasis on alignment- educated on neutral foot and proper alignment with sit to stand Trigger Point Dry Needling  Subsequent Treatment: Instructions provided previously at initial dry needling treatment.   Patient Verbal Consent Given: Yes Education Handout Provided: Previously Provided Muscles Treated: Bil lumbar multifidi, Rt  and Lt gluteals  Electrical  Stimulation Performed: No Treatment Response/Outcome: Utilized skilled palpation to identify bony landmarks and trigger points.  Able to illicit twitch response and muscle elongation.  Soft tissue mobilization to muscles needled following DN to further promote tissue elongation and decreased pain.     Elongation and release to low back and Lt/Rt gluteals    PATIENT EDUCATION:  Education details: [id number], aquatic info and DN info given Person educated: Patient Education method: Explanation, Demonstration, Verbal cues, and Handouts Education comprehension: verbalized understanding and returned demonstration  HOME EXERCISE PROGRAM: Access Code: [email or web address] URL: [email or web address] Date: 06/17/2023 Prepared by: Aletha Anderson  Exercises - Supine Posterior Pelvic Tilt  - 1 x daily - 7 x weekly - 1 sets - 10 reps - Hooklying Single Knee to Chest Stretch  - 1 x daily - 7 x weekly - 1 sets - 10 reps - Supine Lower Trunk Rotation  - 1 x daily - 7 x weekly - 1 sets - 10 reps - Supine Double Knee to Chest  - 1 x daily - 7 x weekly - 1 sets - 10 reps - Seated Quadratus Lumborum Stretch in Chair  - 1 x daily - 7 x weekly - 1 sets - 2 reps - 20-30 hold - Seated Long Arc Quad  - 1 x daily - 7 x weekly - 1 sets - 10 reps - Clamshell  -  2 x daily - 7 x weekly - 2 sets - 10 reps - Sit to Stand Without Arm Support  - 2 x daily - 7 x weekly - 2 sets - 10 reps - Supine Hip Adduction Isometric with Ball  - 1 x daily - 7 x weekly - 2 sets - 10 reps - 5 hold - Side Stepping with Resistance at Ankles  - 1 x daily - 7 x weekly - 3 sets - 10 reps - Standing Clam with Resistance Loop  - 1 x daily - 7 x weekly - 3 sets - 10 reps ASSESSMENT:  CLINICAL IMPRESSION: Pt reports 70-80% overall improvement in symptoms since the start of care.  She does continue to have Rt knee pain.  She has been working on steps since last session and is able to do this with less substitution.  Good response to dry  needling with improved tissue mobility and twitch response today.  Overall reduction in number and size of trigger points overall in the Rt gluteals and low back.  She would benefit from continuing skilled PT to address pain and muscle strength and endurance.     OBJECTIVE IMPAIRMENTS: decreased balance, decreased coordination, decreased mobility, decreased strength, increased muscle spasms, impaired flexibility, improper body mechanics, postural dysfunction, and pain.   ACTIVITY LIMITATIONS: bending, sitting, standing, squatting, sleeping, and stairs  PARTICIPATION LIMITATIONS: cleaning, shopping, and community activity  PERSONAL FACTORS: 1 comorbidity: Hx of Lt meniscus Sx and ongoing Lt heel pain are also affecting patient's functional outcome.   REHAB POTENTIAL: Excellent  CLINICAL DECISION MAKING: Stable/uncomplicated  EVALUATION COMPLEXITY: Moderate   GOALS: Goals reviewed with patient? Yes  SHORT TERM GOALS: Target date: 07/05/23  Pt will be ind with initial HEP Baseline: Goal status: MET  2.  Pt will demo good alignment and body mechanics for functional squat Baseline:  Goal status: MET  3.  Pt will have full symmetrical Lt SB resulting from improved Rt QL flexibility Baseline:  Goal status: INITIAL  4. Pt will learn aquatic program over 1-2 visits to perform ind.  Baseline: hasn't gone to aquatics yet(06/27/23)  Goal Status: In progress    LONG TERM GOALS: Target date: 08/02/23  Pt will be ind with advanced HEP Baseline:  Goal status: INITIAL  2.  Pt will improve ODI score to </= 24% to demo improved function Baseline: 17/50, 34% Goal status: INITIAL  3.  Pt will report improved balance confidence in busy environments such as with her grandkids. Baseline:  Goal status: INITIAL  4.  Pt will achieve 5/5 bil LE strength to improve functional task performance throughout day. Baseline:  Goal status: INITIAL  5.  Pt will report at least 70% reduction of pain  in Rt lumbar spine and Rt LE. Baseline: 70-80% (06/27/23) Goal status: MET    PLAN:  PT FREQUENCY: 2x/week  PT DURATION: 8 weeks  PLANNED INTERVENTIONS: 97110-Therapeutic exercises, 97530- Therapeutic activity, 97112- Neuromuscular re-education, 97535- Self Care, 40981- Manual therapy, [phone]- Aquatic Therapy, G0283- Electrical stimulation (unattended), [id number]- Ultrasound, 82956- Traction (mechanical), [id number]- Ionotophoresis 4mg /ml Dexamethasone, Patient/Family education, Balance training, Dry Needling, Joint mobilization, Spinal mobilization, Cryotherapy, and Moist heat.  PLAN FOR NEXT SESSION: Proceed with 1-2 sessions of aquatic to learn a program for when she joins [facility],  Continue strength and endurance tasks.   LE strength, balance and work on alignment.   Luella Sager, PT 06/27/23 1:22 PM  [facility] [address] [place], [place] [address] Phone # [phone]  Fax [phone]'

## 2023-06-30 ENCOUNTER — Encounter

## 2023-07-04 ENCOUNTER — Other Ambulatory Visit

## 2023-07-06 ENCOUNTER — Encounter: Payer: Self-pay | Admitting: Physical Therapy

## 2023-07-06 ENCOUNTER — Ambulatory Visit: Admitting: Physical Therapy

## 2023-07-06 DIAGNOSIS — M6281 Muscle weakness (generalized): Secondary | ICD-10-CM | POA: Diagnosis not present

## 2023-07-06 DIAGNOSIS — M5459 Other low back pain: Secondary | ICD-10-CM

## 2023-07-06 DIAGNOSIS — R252 Cramp and spasm: Secondary | ICD-10-CM

## 2023-07-06 DIAGNOSIS — R262 Difficulty in walking, not elsewhere classified: Secondary | ICD-10-CM

## 2023-07-06 DIAGNOSIS — M25551 Pain in right hip: Secondary | ICD-10-CM

## 2023-07-06 DIAGNOSIS — M25561 Pain in right knee: Secondary | ICD-10-CM

## 2023-07-06 DIAGNOSIS — R293 Abnormal posture: Secondary | ICD-10-CM

## 2023-07-06 NOTE — Therapy (Signed)
 OUTPATIENT PHYSICAL THERAPY THORACOLUMBAR TREATMENT NOTE   Patient Name: Jenna Caldwell MRN: [id number] DOB:[birth year], 68 y.o., female Today's Date: 07/06/2023  END OF SESSION:  PT End of Session - 07/06/23 1113     Visit Number 8    Date for PT Re-Evaluation 08/02/23    Authorization Type Medicare part A/B    Progress Note Due on Visit 10    PT Start Time 0845    PT Stop Time 0930    PT Time Calculation (min) 45 min    Activity Tolerance Patient tolerated treatment well    Behavior During Therapy WFL for tasks assessed/performed                 Past Medical History:  Diagnosis Date   Allergy    Sulfa and metformin severe reaction   Anxiety    Arthritis    Depression    Diabetes mellitus without complication (HCC) 2003   Pre diabetes   Hypertension    Osteopenia    Renal disease    Stage 3B CKD   Past Surgical History:  Procedure Laterality Date   ABLATION     Endometrial   APPENDECTOMY  1967   In sixth gradw   DILATION AND CURETTAGE OF UTERUS     KNEE SURGERY Left    Menisus   MOUTH SURGERY     08/2011   TONSILLECTOMY     Patient Active Problem List   Diagnosis Date Noted   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 06/24/2023   Chronic pain of right knee 06/24/2023   CKD (chronic kidney disease) stage 3, GFR 30-59 ml/min (HCC) 05/25/2023   Anxiety and depression 12/10/2022   Primary hypertension 12/10/2022    PCP: Rebecca Campus, NP  REFERRING PROVIDER: Syliva Even, MD  REFERRING DIAG: [phone] (ICD-10-CM) - Chronic pain of right knee M25.551 (ICD-10-CM) - Right hip pain M54.41,G89.29 (ICD-10-CM) - Chronic right-sided low back pain with right-sided sciatica  Rationale for Evaluation and Treatment: Rehabilitation  THERAPY DIAG:  Muscle weakness (generalized)  Cramp and spasm  Other low back pain  Pain in right hip  Acute pain of right knee  Abnormal posture  Difficulty in walking, not elsewhere  classified  ONSET DATE: moved 5 mos ago and symptoms flared up - fell down steps when carrying items  SUBJECTIVE:                                                                                                                                                                                           SUBJECTIVE STATEMENT: Doing pretty good this morning. I really enjoy exercising in the  water.  Initial eval: Pt moved 5mos ago and during packing process fell down steps which started Rt hip pain.  Also traveled in new RV to [place] which exacerbated pain and had another fall (backwards) onto Rt hip. Have been in RV for several months.  Tight spaces make for difficult getting around at times.  This all made me feel like maybe I need some PT.  I am realizing I am better when I am up and moving. Will be here in [place] until later in the summer when we will go out on our RV again.  Pain starts in Rt low back in a specific place and travels down the leg (Pt outlines the L4 nerve root pattern). Worse in the AM and better with movement but yet I can overdo it. I am afraid of falling - my knees are shaky and didn't used to be.  PERTINENT HISTORY:  Lt meniscus surgery 2008  PAIN:  PAIN:  06/27/23 Are you having pain? None right now NPRS scale: 0-9/10 Pain location: Rt SI region and wraps to lateral hip, anterior thigh, ant/medial leg Pain orientation: Right  PAIN TYPE: aching and sharp (medial knee) and deep Pain description: constant but level fluctuates Aggravating factors: worse in the AM Relieving factors: movement but not overdoing, wears knee brace on Rt   PRECAUTIONS: Fall  RED FLAGS: None   WEIGHT BEARING RESTRICTIONS: No  FALLS:  Has patient fallen in last 6 months? Yes. Number of falls 2 - fell down stairs carrying box, fell adjusting shoe on pool deck  LIVING ENVIRONMENT: Lives with: lives with their spouse Lives in: House/apartment Stairs: Yes: Internal: 4 levels steps;   and  External: none from garage steps;   Has following equipment at home: None  OCCUPATION: retired  PLOF: Independent  PATIENT GOALS: reduce pain in hip, improve balance and strength, improve balance confidence  NEXT MD VISIT: 2 weeks  OBJECTIVE:  Note: Objective measures were completed at Evaluation unless otherwise noted.  DIAGNOSTIC FINDINGS:  2025 xrays: Pelvic/Hip IMPRESSION: 1. Mild bilateral hip osteoarthritis, left-greater-than-right. 2. Moderate pubic symphysis osteoarthritis.  Rt knee IMPRESSION: Mild-to-moderate medial compartment and patellofemoral compartment osteoarthritis.  Lumbar: IMPRESSION: Degenerative joint changes of lumbar spine.  PATIENT SURVEYS:  Modified Oswestry 34%   COGNITION: Overall cognitive status: Within functional limits for tasks assessed     SENSATION: WFL  MUSCLE LENGTH:   POSTURE: increased lumbar lordosis  PALPATION: Very tender: Rt side gluteals, QL, greater trochanter  LUMBAR ROM:   AROM eval  Flexion Full, HS stretch end range  Extension Full no pain  Right lateral flexion full  Left lateral flexion 75% compared to Rt flexion, stretch on Rt side  Right rotation WNL, Rt pain  Left rotation WNL   (Blank rows = not tested)  LOWER EXTREMITY ROM:    Bil hips WNL  Passive  Right eval Left eval  Hip flexion    Hip extension    Hip abduction    Hip adduction    Hip internal rotation    Hip external rotation    Knee flexion 125 with pain 125  Knee extension    Ankle dorsiflexion    Ankle plantarflexion    Ankle inversion    Ankle eversion     (Blank rows = not tested)  LOWER EXTREMITY MMT:   4/5 Rt hip abd and ER Rt knee 4/5 with mild pain inhibition   FUNCTIONAL TESTS:  Challenged in SLS balance bil LE Able to squat but  unsteady   TREATMENT DATE:   07/06/23: Pt arrives for aquatic physical therapy. Treatment took place in 3.5-5.5 feet of water. Water temperature was 91 degrees F. Pt entered the pool  via stairs independently using stairs. Pt requires buoyancy of water for support and to offload joints with strengthening exercises.  Seated water bench with 75% submersion Pt performed seated LE AROM exercises 20x in all planes with concurrent verbal education on water principles, about the [facility] pool (rules/availability) and how to progress a basic water program. Pt verbally understood all concepts. Seated lymphatic massage for LE edema. Instructed pt how to do with correct return demo 10x. Instructed pt to try this 1x day to see if helps the swelling in her knees and lower leg.  75% depth water walking with natural arms: 6x in each direction. Bil heel raises 2x10. Bil hip 3 ways 15x each: VC for standing leg (RT > LT) to stand firmer to decompress through her knee. Practice bicycle in reclined position, pt has trouble controlling her middle (core).   06/27/23: Nustep x 6 min level 5 (PT present to discuss status) Standing rockerboard x2 min Up and down steps with forward facing posture, step-over step with use of rails Standing hamstring stretch 3 x  30 sec- used power plate  Sit to stand with 5# kettlebell; parallel feet and staggered stance x10 each  Rockerboard against wall- gastroc stretch  Aquatic info issued  Trigger Point Dry Needling Subsequent Treatment: Instructions provided previously at initial dry needling treatment.   Patient Verbal Consent Given: Yes Education Handout Provided: Previously Provided Muscles Treated: Bil lumbar multifidi, Rt  and Lt gluteals  Electrical Stimulation Performed: No Treatment Response/Outcome: Utilized skilled palpation to identify bony landmarks and trigger points.  Able to illicit twitch response and muscle elongation.  Soft tissue mobilization to muscles needled following DN to further promote tissue elongation and decreased pain.      06/22/23: Nustep x 6 min level 5 (PT present to discuss status) Standing rockerboard x3 Gastroc stretch at  wall and on steps  Work on step down 6" with neutral hip and knee alignment x15 Standing hamstring stretch 3 x  30 sec- used power plate  Standing quad stretch 3 x 30 sec- used power plate  Standing on balance pad: hip abduction 2x10 bil  Sit to stand 2 x 10 with emphasis on alignment- educated on neutral foot and proper alignment with sit to stand   PATIENT EDUCATION:  Education details: [id number], aquatic info and DN info given Person educated: Patient Education method: Explanation, Demonstration, Verbal cues, and Handouts Education comprehension: verbalized understanding and returned demonstration  HOME EXERCISE PROGRAM: Access Code: [email or web address] URL: [email or web address] Date: 06/17/2023 Prepared by: Aletha Anderson  Exercises - Supine Posterior Pelvic Tilt  - 1 x daily - 7 x weekly - 1 sets - 10 reps - Hooklying Single Knee to Chest Stretch  - 1 x daily - 7 x weekly - 1 sets - 10 reps - Supine Lower Trunk Rotation  - 1 x daily - 7 x weekly - 1 sets - 10 reps - Supine Double Knee to Chest  - 1 x daily - 7 x weekly - 1 sets - 10 reps - Seated Quadratus Lumborum Stretch in Chair  - 1 x daily - 7 x weekly - 1 sets - 2 reps - 20-30 hold - Seated Long Arc Quad  - 1 x daily - 7 x weekly - 1 sets - 10 reps - Clamshell  -  2 x daily - 7 x weekly - 2 sets - 10 reps - Sit to Stand Without Arm Support  - 2 x daily - 7 x weekly - 2 sets - 10 reps - Supine Hip Adduction Isometric with Ball  - 1 x daily - 7 x weekly - 2 sets - 10 reps - 5 hold - Side Stepping with Resistance at Ankles  - 1 x daily - 7 x weekly - 3 sets - 10 reps - Standing Clam with Resistance Loop  - 1 x daily - 7 x weekly - 3 sets - 10 reps ASSESSMENT:  CLINICAL IMPRESSION: Pt arrived to aquatic PT with no pain in her knees or hips. She did experience some medial RT knee pain in single leg stance exercises. This reduced to almost 0/10 if she grounded through her LE, lifting through her pelvis better and  decompressed her knee joint. Pt was educated in water principles and how to help stimulate her lymph nodes behind her knee to help reduce her edema.   OBJECTIVE IMPAIRMENTS: decreased balance, decreased coordination, decreased mobility, decreased strength, increased muscle spasms, impaired flexibility, improper body mechanics, postural dysfunction, and pain.   ACTIVITY LIMITATIONS: bending, sitting, standing, squatting, sleeping, and stairs  PARTICIPATION LIMITATIONS: cleaning, shopping, and community activity  PERSONAL FACTORS: 1 comorbidity: Hx of Lt meniscus Sx and ongoing Lt heel pain are also affecting patient's functional outcome.   REHAB POTENTIAL: Excellent  CLINICAL DECISION MAKING: Stable/uncomplicated  EVALUATION COMPLEXITY: Moderate   GOALS: Goals reviewed with patient? Yes  SHORT TERM GOALS: Target date: 07/05/23  Pt will be ind with initial HEP Baseline: Goal status: MET  2.  Pt will demo good alignment and body mechanics for functional squat Baseline:  Goal status: MET  3.  Pt will have full symmetrical Lt SB resulting from improved Rt QL flexibility Baseline:  Goal status: INITIAL  4. Pt will learn aquatic program over 1-2 visits to perform ind.  Baseline: hasn't gone to aquatics yet(06/27/23)  Goal Status: In progress    LONG TERM GOALS: Target date: 08/02/23  Pt will be ind with advanced HEP Baseline:  Goal status: INITIAL  2.  Pt will improve ODI score to </= 24% to demo improved function Baseline: 17/50, 34% Goal status: INITIAL  3.  Pt will report improved balance confidence in busy environments such as with her grandkids. Baseline:  Goal status: INITIAL  4.  Pt will achieve 5/5 bil LE strength to improve functional task performance throughout day. Baseline:  Goal status: INITIAL  5.  Pt will report at least 70% reduction of pain in Rt lumbar spine and Rt LE. Baseline: 70-80% (06/27/23) Goal status: MET    PLAN:  PT FREQUENCY:  2x/week  PT DURATION: 8 weeks  PLANNED INTERVENTIONS: 97110-Therapeutic exercises, 97530- Therapeutic activity, 97112- Neuromuscular re-education, 97535- Self Care, 11914- Manual therapy, [phone]- Aquatic Therapy, G0283- Electrical stimulation (unattended), [id number]- Ultrasound, 62130- Traction (mechanical), [id number]- Ionotophoresis 4mg /ml Dexamethasone, Patient/Family education, Balance training, Dry Needling, Joint mobilization, Spinal mobilization, Cryotherapy, and Moist heat.  PLAN FOR NEXT SESSION: Proceed with 1 more session of aquatic to learn and review program for when she joins [facility],  Continue strength and endurance tasks.   LE strength, balance and work on alignment.   Bethanne Brooks, PTA 07/06/23 11:14 AM    [facility] [address], [address] [place], [place] [address] Phone # [phone] Fax [phone]'

## 2023-07-08 ENCOUNTER — Ambulatory Visit: Admitting: Physical Therapy

## 2023-07-08 ENCOUNTER — Encounter

## 2023-07-08 ENCOUNTER — Encounter: Payer: Self-pay | Admitting: Physical Therapy

## 2023-07-08 DIAGNOSIS — R293 Abnormal posture: Secondary | ICD-10-CM

## 2023-07-08 DIAGNOSIS — M25561 Pain in right knee: Secondary | ICD-10-CM

## 2023-07-08 DIAGNOSIS — R252 Cramp and spasm: Secondary | ICD-10-CM

## 2023-07-08 DIAGNOSIS — M5459 Other low back pain: Secondary | ICD-10-CM

## 2023-07-08 DIAGNOSIS — M25551 Pain in right hip: Secondary | ICD-10-CM

## 2023-07-08 DIAGNOSIS — R262 Difficulty in walking, not elsewhere classified: Secondary | ICD-10-CM

## 2023-07-08 DIAGNOSIS — M6281 Muscle weakness (generalized): Secondary | ICD-10-CM

## 2023-07-08 NOTE — Therapy (Signed)
 OUTPATIENT PHYSICAL THERAPY THORACOLUMBAR TREATMENT NOTE   Patient Name: Jenna Caldwell MRN: [id number] DOB:[birth year], 68 y.o., female Today's Date: 07/08/2023  END OF SESSION:  PT End of Session - 07/08/23 1203     Visit Number 9    Date for PT Re-Evaluation 08/02/23    Authorization Type Medicare part A/B    Progress Note Due on Visit 10    PT Start Time 1203    PT Stop Time 1304    PT Time Calculation (min) 61 min    Activity Tolerance Patient tolerated treatment well    Behavior During Therapy WFL for tasks assessed/performed                  Past Medical History:  Diagnosis Date   Allergy    Sulfa and metformin severe reaction   Anxiety    Arthritis    Depression    Diabetes mellitus without complication (HCC) 2003   Pre diabetes   Hypertension    Osteopenia    Renal disease    Stage 3B CKD   Past Surgical History:  Procedure Laterality Date   ABLATION     Endometrial   APPENDECTOMY  1967   In sixth gradw   DILATION AND CURETTAGE OF UTERUS     KNEE SURGERY Left    Menisus   MOUTH SURGERY     08/2011   TONSILLECTOMY     Patient Active Problem List   Diagnosis Date Noted   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 06/24/2023   Chronic pain of right knee 06/24/2023   CKD (chronic kidney disease) stage 3, GFR 30-59 ml/min (HCC) 05/25/2023   Anxiety and depression 12/10/2022   Primary hypertension 12/10/2022    PCP: Rebecca Campus, NP  REFERRING PROVIDER: Syliva Even, MD  REFERRING DIAG: [phone] (ICD-10-CM) - Chronic pain of right knee M25.551 (ICD-10-CM) - Right hip pain M54.41,G89.29 (ICD-10-CM) - Chronic right-sided low back pain with right-sided sciatica  Rationale for Evaluation and Treatment: Rehabilitation  THERAPY DIAG:  Muscle weakness (generalized)  Cramp and spasm  Other low back pain  Pain in right hip  Acute pain of right knee  Difficulty in walking, not elsewhere classified  Abnormal  posture  ONSET DATE: moved 5 mos ago and symptoms flared up - fell down steps when carrying items  SUBJECTIVE:                                                                                                                                                                                           SUBJECTIVE STATEMENT: Reports doing very well after aquatics on Wednesday. Medial RT  knee is just a little sore today and Lt achilles area feels tight. My husband and I joined [facility] today.  Initial eval: Pt moved 5mos ago and during packing process fell down steps which started Rt hip pain.  Also traveled in new RV to [place] which exacerbated pain and had another fall (backwards) onto Rt hip. Have been in RV for several months.  Tight spaces make for difficult getting around at times.  This all made me feel like maybe I need some PT.  I am realizing I am better when I am up and moving. Will be here in [place] until later in the summer when we will go out on our RV again.  Pain starts in Rt low back in a specific place and travels down the leg (Pt outlines the L4 nerve root pattern). Worse in the AM and better with movement but yet I can overdo it. I am afraid of falling - my knees are shaky and didn't used to be.  PERTINENT HISTORY:  Lt meniscus surgery 2008  PAIN:  PAIN:  06/27/23 Are you having pain? None right now NPRS scale: 0-9/10 Pain location: Rt SI region and wraps to lateral hip, anterior thigh, ant/medial leg Pain orientation: Right  PAIN TYPE: aching and sharp (medial knee) and deep Pain description: constant but level fluctuates Aggravating factors: worse in the AM Relieving factors: movement but not overdoing, wears knee brace on Rt   PRECAUTIONS: Fall  RED FLAGS: None   WEIGHT BEARING RESTRICTIONS: No  FALLS:  Has patient fallen in last 6 months? Yes. Number of falls 2 - fell down stairs carrying box, fell adjusting shoe on pool deck  LIVING ENVIRONMENT: Lives with:  lives with their spouse Lives in: House/apartment Stairs: Yes: Internal: 4 levels steps;   and External: none from garage steps;   Has following equipment at home: None  OCCUPATION: retired  PLOF: Independent  PATIENT GOALS: reduce pain in hip, improve balance and strength, improve balance confidence  NEXT MD VISIT: 2 weeks  OBJECTIVE:  Note: Objective measures were completed at Evaluation unless otherwise noted.  DIAGNOSTIC FINDINGS:  2025 xrays: Pelvic/Hip IMPRESSION: 1. Mild bilateral hip osteoarthritis, left-greater-than-right. 2. Moderate pubic symphysis osteoarthritis.  Rt knee IMPRESSION: Mild-to-moderate medial compartment and patellofemoral compartment osteoarthritis.  Lumbar: IMPRESSION: Degenerative joint changes of lumbar spine.  PATIENT SURVEYS:  Modified Oswestry 34%   COGNITION: Overall cognitive status: Within functional limits for tasks assessed     SENSATION: WFL  MUSCLE LENGTH:   POSTURE: increased lumbar lordosis  PALPATION: Very tender: Rt side gluteals, QL, greater trochanter  LUMBAR ROM:   AROM eval  Flexion Full, HS stretch end range  Extension Full no pain  Right lateral flexion full  Left lateral flexion 75% compared to Rt flexion, stretch on Rt side  Right rotation WNL, Rt pain  Left rotation WNL   (Blank rows = not tested)  LOWER EXTREMITY ROM:    Bil hips WNL  Passive  Right eval Left eval  Hip flexion    Hip extension    Hip abduction    Hip adduction    Hip internal rotation    Hip external rotation    Knee flexion 125 with pain 125  Knee extension    Ankle dorsiflexion    Ankle plantarflexion    Ankle inversion    Ankle eversion     (Blank rows = not tested)  LOWER EXTREMITY MMT:   4/5 Rt hip abd and ER Rt knee 4/5  with mild pain inhibition   FUNCTIONAL TESTS:  Challenged in SLS balance bil LE Able to squat but unsteady   TREATMENT DATE:   07/08/23: Pt arrives for aquatic physical therapy.  Treatment took place in 3.5-5.5 feet of water. Water temperature was 91 degrees F. Pt entered the pool via stairs independently using stairs. Pt requires buoyancy of water for support and to offload joints with strengthening exercises.  Seated water bench with 75% submersion Pt performed seated LE AROM exercises 20x in all planes with concurrent review of pool hours and availablity. Review seated lymphatic massage for LE edema, performed 10x.  75% depth water walking with natural arms: 8x in each direction. LTLE calf stretch 3x 30 sec. Bil heel raises 2x15. Bil hip 3 ways 15x each: VC for standing leg (RT > LT) to stand firmer to decompress through her knee. Seated decompression/LE hang 2 min f/b 3 min underwater bicycle on large noodle in horseback.  07/06/23: Pt arrives for aquatic physical therapy. Treatment took place in 3.5-5.5 feet of water. Water temperature was 91 degrees F. Pt entered the pool via stairs independently using stairs. Pt requires buoyancy of water for support and to offload joints with strengthening exercises.  Seated water bench with 75% submersion Pt performed seated LE AROM exercises 20x in all planes with concurrent verbal education on water principles, about the [facility] pool (rules/availability) and how to progress a basic water program. Pt verbally understood all concepts. Seated lymphatic massage for LE edema. Instructed pt how to do with correct return demo 10x. Instructed pt to try this 1x day to see if helps the swelling in her knees and lower leg.  75% depth water walking with natural arms: 6x in each direction. Bil heel raises 2x10. Bil hip 3 ways 15x each: VC for standing leg (RT > LT) to stand firmer to decompress through her knee. Practice bicycle in reclined position, pt has trouble controlling her middle (core).   06/27/23: Nustep x 6 min level 5 (PT present to discuss status) Standing rockerboard x2 min Up and down steps with forward facing posture, step-over step  with use of rails Standing hamstring stretch 3 x  30 sec- used power plate  Sit to stand with 5# kettlebell; parallel feet and staggered stance x10 each  Rockerboard against wall- gastroc stretch  Aquatic info issued  Trigger Point Dry Needling Subsequent Treatment: Instructions provided previously at initial dry needling treatment.   Patient Verbal Consent Given: Yes Education Handout Provided: Previously Provided Muscles Treated: Bil lumbar multifidi, Rt  and Lt gluteals  Electrical Stimulation Performed: No Treatment Response/Outcome: Utilized skilled palpation to identify bony landmarks and trigger points.  Able to illicit twitch response and muscle elongation.  Soft tissue mobilization to muscles needled following DN to further promote tissue elongation and decreased pain.      PATIENT EDUCATION:  Education details: [id number], aquatic info and DN info given Person educated: Patient Education method: Explanation, Demonstration, Verbal cues, and Handouts Education comprehension: verbalized understanding and returned demonstration  HOME EXERCISE PROGRAM: Access Code: [email or web address] URL: [email or web address] Date: 06/17/2023 Prepared by: Aletha Anderson  Exercises - Supine Posterior Pelvic Tilt  - 1 x daily - 7 x weekly - 1 sets - 10 reps - Hooklying Single Knee to Chest Stretch  - 1 x daily - 7 x weekly - 1 sets - 10 reps - Supine Lower Trunk Rotation  - 1 x daily - 7 x weekly - 1 sets - 10 reps - Supine Double Knee  to Chest  - 1 x daily - 7 x weekly - 1 sets - 10 reps - Seated Quadratus Lumborum Stretch in Chair  - 1 x daily - 7 x weekly - 1 sets - 2 reps - 20-30 hold - Seated Long Arc Quad  - 1 x daily - 7 x weekly - 1 sets - 10 reps - Clamshell  - 2 x daily - 7 x weekly - 2 sets - 10 reps - Sit to Stand Without Arm Support  - 2 x daily - 7 x weekly - 2 sets - 10 reps - Supine Hip Adduction Isometric with Ball  - 1 x daily - 7 x weekly - 2 sets - 10 reps - 5 hold -  Side Stepping with Resistance at Ankles  - 1 x daily - 7 x weekly - 3 sets - 10 reps - Standing Clam with Resistance Loop  - 1 x daily - 7 x weekly - 3 sets - 10 reps ASSESSMENT:  CLINICAL IMPRESSION: Pt did very well after her initial aquatic session. No increased pain. General review and performance of all basic TE which pt performed correctly. Added some LT achilles stretching today. Pt continues to have many questions regarding exercise appropriateness and progression. Would benefit from 1 more session.   OBJECTIVE IMPAIRMENTS: decreased balance, decreased coordination, decreased mobility, decreased strength, increased muscle spasms, impaired flexibility, improper body mechanics, postural dysfunction, and pain.   ACTIVITY LIMITATIONS: bending, sitting, standing, squatting, sleeping, and stairs  PARTICIPATION LIMITATIONS: cleaning, shopping, and community activity  PERSONAL FACTORS: 1 comorbidity: Hx of Lt meniscus Sx and ongoing Lt heel pain are also affecting patient's functional outcome.   REHAB POTENTIAL: Excellent  CLINICAL DECISION MAKING: Stable/uncomplicated  EVALUATION COMPLEXITY: Moderate   GOALS: Goals reviewed with patient? Yes  SHORT TERM GOALS: Target date: 07/05/23  Pt will be ind with initial HEP Baseline: Goal status: MET  2.  Pt will demo good alignment and body mechanics for functional squat Baseline:  Goal status: MET  3.  Pt will have full symmetrical Lt SB resulting from improved Rt QL flexibility Baseline:  Goal status: INITIAL  4. Pt will learn aquatic program over 1-2 visits to perform ind.  Baseline: hasn't gone to aquatics yet(06/27/23)  Goal Status: In progress    LONG TERM GOALS: Target date: 08/02/23  Pt will be ind with advanced HEP Baseline:  Goal status: INITIAL  2.  Pt will improve ODI score to </= 24% to demo improved function Baseline: 17/50, 34% Goal status: INITIAL  3.  Pt will report improved balance confidence in busy  environments such as with her grandkids. Baseline:  Goal status: INITIAL  4.  Pt will achieve 5/5 bil LE strength to improve functional task performance throughout day. Baseline:  Goal status: INITIAL  5.  Pt will report at least 70% reduction of pain in Rt lumbar spine and Rt LE. Baseline: 70-80% (06/27/23) Goal status: MET    PLAN:  PT FREQUENCY: 2x/week  PT DURATION: 8 weeks  PLANNED INTERVENTIONS: 97110-Therapeutic exercises, 97530- Therapeutic activity, 97112- Neuromuscular re-education, 97535- Self Care, 16109- Manual therapy, [phone]- Aquatic Therapy, G0283- Electrical stimulation (unattended), [id number]- Ultrasound, 09811- Traction (mechanical), [id number]- Ionotophoresis 4mg /ml Dexamethasone, Patient/Family education, Balance training, Dry Needling, Joint mobilization, Spinal mobilization, Cryotherapy, and Moist heat.  PLAN FOR NEXT SESSION:1 more session then DC aquatics   Bethanne Brooks, PTA 07/08/23 1:04 PM    [facility] [address] [place], [place] [address] Phone # [phone]  Fax [phone]'

## 2023-07-09 ENCOUNTER — Ambulatory Visit
Admission: RE | Admit: 2023-07-09 | Discharge: 2023-07-09 | Disposition: A | Discharge status: Home / Self Care | Source: Ambulatory Visit | Attending: Family Medicine | Admitting: Family Medicine

## 2023-07-09 DIAGNOSIS — G8929 Other chronic pain: Secondary | ICD-10-CM

## 2023-07-12 ENCOUNTER — Ambulatory Visit

## 2023-07-12 DIAGNOSIS — R262 Difficulty in walking, not elsewhere classified: Secondary | ICD-10-CM

## 2023-07-12 DIAGNOSIS — M6281 Muscle weakness (generalized): Secondary | ICD-10-CM

## 2023-07-12 DIAGNOSIS — R252 Cramp and spasm: Secondary | ICD-10-CM

## 2023-07-12 DIAGNOSIS — M25561 Pain in right knee: Secondary | ICD-10-CM

## 2023-07-12 DIAGNOSIS — M25551 Pain in right hip: Secondary | ICD-10-CM

## 2023-07-12 DIAGNOSIS — M5459 Other low back pain: Secondary | ICD-10-CM

## 2023-07-12 NOTE — Therapy (Signed)
 OUTPATIENT PHYSICAL THERAPY THORACOLUMBAR TREATMENT NOTE   Patient Name: Jenna Caldwell MRN: [id number] DOB:[birth year], 68 y.o., female Today's Date: 07/12/2023 Progress Note Reporting Period 06/07/23 to 07/12/23  See note below for Objective Data and Assessment of Progress/Goals.     END OF SESSION:  PT End of Session - 07/12/23 1321     Visit Number 10    Date for PT Re-Evaluation 08/02/23    Authorization Type Medicare part A/B    Progress Note Due on Visit 20    PT Start Time 1232    PT Stop Time 1319    PT Time Calculation (min) 47 min    Activity Tolerance Patient tolerated treatment well    Behavior During Therapy WFL for tasks assessed/performed                   Past Medical History:  Diagnosis Date   Allergy    Sulfa and metformin severe reaction   Anxiety    Arthritis    Depression    Diabetes mellitus without complication (HCC) 2003   Pre diabetes   Hypertension    Osteopenia    Renal disease    Stage 3B CKD   Past Surgical History:  Procedure Laterality Date   ABLATION     Endometrial   APPENDECTOMY  1967   In sixth gradw   DILATION AND CURETTAGE OF UTERUS     KNEE SURGERY Left    Menisus   MOUTH SURGERY     08/2011   TONSILLECTOMY     Patient Active Problem List   Diagnosis Date Noted   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 06/24/2023   Chronic pain of right knee 06/24/2023   CKD (chronic kidney disease) stage 3, GFR 30-59 ml/min (HCC) 05/25/2023   Anxiety and depression 12/10/2022   Primary hypertension 12/10/2022    PCP: Rebecca Campus, NP  REFERRING PROVIDER: Syliva Even, MD  REFERRING DIAG: [phone] (ICD-10-CM) - Chronic pain of right knee M25.551 (ICD-10-CM) - Right hip pain M54.41,G89.29 (ICD-10-CM) - Chronic right-sided low back pain with right-sided sciatica  Rationale for Evaluation and Treatment: Rehabilitation  THERAPY DIAG:  Muscle weakness (generalized)  Cramp and spasm  Other low  back pain  Pain in right hip  Acute pain of right knee  Difficulty in walking, not elsewhere classified  ONSET DATE: moved 5 mos ago and symptoms flared up - fell down steps when carrying items  SUBJECTIVE:  SUBJECTIVE STATEMENT:  I got an MRI of my Rt knee on Saturday. Knee is more painful today.  Hip and back feel good-100% improved   Initial eval: Pt moved 5mos ago and during packing process fell down steps which started Rt hip pain.  Also traveled in new RV to [place] which exacerbated pain and had another fall (backwards) onto Rt hip. Have been in RV for several months.  Tight spaces make for difficult getting around at times.  This all made me feel like maybe I need some PT.  I am realizing I am better when I am up and moving. Will be here in [place] until later in the summer when we will go out on our RV again.  Pain starts in Rt low back in a specific place and travels down the leg (Pt outlines the L4 nerve root pattern). Worse in the AM and better with movement but yet I can overdo it. I am afraid of falling - my knees are shaky and didn't used to be.  PERTINENT HISTORY:  Lt meniscus surgery 2008  PAIN:  PAIN:  06/27/23 Are you having pain? Yes  NPRS scale: 4/10 Pain location: Rt knee  Pain orientation: Right  PAIN TYPE: stiff Pain description: constant but level fluctuates Aggravating factors: worse in the AM, standing and walking  Relieving factors: movement but not overdoing, wears knee brace on Rt   PRECAUTIONS: Fall  RED FLAGS: None   WEIGHT BEARING RESTRICTIONS: No  FALLS:  Has patient fallen in last 6 months? Yes. Number of falls 2 - fell down stairs carrying box, fell adjusting shoe on pool deck  LIVING ENVIRONMENT: Lives with: lives with their spouse Lives in:  House/apartment Stairs: Yes: Internal: 4 levels steps;   and External: none from garage steps;   Has following equipment at home: None  OCCUPATION: retired  PLOF: Independent  PATIENT GOALS: reduce pain in hip, improve balance and strength, improve balance confidence  NEXT MD VISIT: 2 weeks  OBJECTIVE:  Note: Objective measures were completed at Evaluation unless otherwise noted.  DIAGNOSTIC FINDINGS:  MRI Rt knee: 07/09/23:  IMPRESSION: Mild degenerative arthrosis with chondromalacia and small reactive joint effusion. Mild prepatellar edema and small prepatellar collection.   Likely tear of the posterior horn and posterior body of the medial meniscus. Small centrally displaced flap may be present best seen on coronal images 14 and 15 and sagittal image 13.   2025 xrays: Pelvic/Hip IMPRESSION: 1. Mild bilateral hip osteoarthritis, left-greater-than-right. 2. Moderate pubic symphysis osteoarthritis.  Rt knee IMPRESSION: Mild-to-moderate medial compartment and patellofemoral compartment osteoarthritis.  Lumbar: IMPRESSION: Degenerative joint changes of lumbar spine.  PATIENT SURVEYS:  Modified Oswestry 34%   COGNITION: Overall cognitive status: Within functional limits for tasks assessed     SENSATION: WFL  MUSCLE LENGTH:   POSTURE: increased lumbar lordosis  PALPATION: Very tender: Rt side gluteals, QL, greater trochanter  LUMBAR ROM:   AROM eval  Flexion Full, HS stretch end range  Extension Full no pain  Right lateral flexion full  Left lateral flexion 75% compared to Rt flexion, stretch on Rt side  Right rotation WNL, Rt pain  Left rotation WNL   (Blank rows = not tested)  LOWER EXTREMITY ROM:    Bil hips WNL  Passive  Right eval Left eval  Hip flexion    Hip extension    Hip abduction    Hip adduction    Hip internal rotation    Hip external rotation  Knee flexion 125 with pain 125  Knee extension    Ankle dorsiflexion    Ankle  plantarflexion    Ankle inversion    Ankle eversion     (Blank rows = not tested)  LOWER EXTREMITY MMT:   4/5 Rt hip abd and ER Rt knee 4/5 with mild pain inhibition   FUNCTIONAL TESTS:  Challenged in SLS balance bil LE Able to squat but unsteady   TREATMENT DATE:  07/12/23: Nustep x 6 min level 5 (PT present to discuss status) Standing rockerboard x2 min Wall stretch with rockerboard x10 4" step down rt and Lt - work on alignment and form- multiple trials and discussion regarding technique Seated hamstring stretch 3x20 seconds  Sit to stand with 5# kettlebell; parallel feet and staggered stance x10 each  Forward T with min UE support on countertop  07/08/23: Pt arrives for aquatic physical therapy. Treatment took place in 3.5-5.5 feet of water. Water temperature was 91 degrees F. Pt entered the pool via stairs independently using stairs. Pt requires buoyancy of water for support and to offload joints with strengthening exercises.  Seated water bench with 75% submersion Pt performed seated LE AROM exercises 20x in all planes with concurrent review of pool hours and availablity. Review seated lymphatic massage for LE edema, performed 10x.  75% depth water walking with natural arms: 8x in each direction. LTLE calf stretch 3x 30 sec. Bil heel raises 2x15. Bil hip 3 ways 15x each: VC for standing leg (RT > LT) to stand firmer to decompress through her knee. Seated decompression/LE hang 2 min f/b 3 min underwater bicycle on large noodle in horseback.  07/06/23: Pt arrives for aquatic physical therapy. Treatment took place in 3.5-5.5 feet of water. Water temperature was 91 degrees F. Pt entered the pool via stairs independently using stairs. Pt requires buoyancy of water for support and to offload joints with strengthening exercises.  Seated water bench with 75% submersion Pt performed seated LE AROM exercises 20x in all planes with concurrent verbal education on water principles, about the  [facility] pool (rules/availability) and how to progress a basic water program. Pt verbally understood all concepts. Seated lymphatic massage for LE edema. Instructed pt how to do with correct return demo 10x. Instructed pt to try this 1x day to see if helps the swelling in her knees and lower leg.  75% depth water walking with natural arms: 6x in each direction. Bil heel raises 2x10. Bil hip 3 ways 15x each: VC for standing leg (RT > LT) to stand firmer to decompress through her knee. Practice bicycle in reclined position, pt has trouble controlling her middle (core).   06/27/23: Nustep x 6 min level 5 (PT present to discuss status) Standing rockerboard x2 min Up and down steps with forward facing posture, step-over step with use of rails Standing hamstring stretch 3 x  30 sec- used power plate  Sit to stand with 5# kettlebell; parallel feet and staggered stance x10 each  Rockerboard against wall- gastroc stretch  Aquatic info issued  Trigger Point Dry Needling Subsequent Treatment: Instructions provided previously at initial dry needling treatment.   Patient Verbal Consent Given: Yes Education Handout Provided: Previously Provided Muscles Treated: Bil lumbar multifidi, Rt  and Lt gluteals  Electrical Stimulation Performed: No Treatment Response/Outcome: Utilized skilled palpation to identify bony landmarks and trigger points.  Able to illicit twitch response and muscle elongation.  Soft tissue mobilization to muscles needled following DN to further promote tissue elongation and  decreased pain.      PATIENT EDUCATION:  Education details: [id number], aquatic info and DN info given Person educated: Patient Education method: Explanation, Demonstration, Verbal cues, and Handouts Education comprehension: verbalized understanding and returned demonstration  HOME EXERCISE PROGRAM: Access Code: [email or web address] URL: [email or web address] Date: 07/12/2023 Prepared by: Loetta Ringer  Exercises -  Supine Posterior Pelvic Tilt  - 1 x daily - 7 x weekly - 1 sets - 10 reps - Hooklying Single Knee to Chest Stretch  - 1 x daily - 7 x weekly - 1 sets - 10 reps - Supine Lower Trunk Rotation  - 1 x daily - 7 x weekly - 1 sets - 10 reps - Supine Double Knee to Chest  - 1 x daily - 7 x weekly - 1 sets - 10 reps - Seated Quadratus Lumborum Stretch in Chair  - 1 x daily - 7 x weekly - 1 sets - 2 reps - 20-30 hold - Seated Long Arc Quad  - 1 x daily - 7 x weekly - 1 sets - 10 reps - Clamshell  - 2 x daily - 7 x weekly - 2 sets - 10 reps - Sit to Stand Without Arm Support  - 2 x daily - 7 x weekly - 2 sets - 10 reps - Supine Hip Adduction Isometric with Ball  - 1 x daily - 7 x weekly - 2 sets - 10 reps - 5 hold - Side Stepping with Resistance at Ankles  - 1 x daily - 7 x weekly - 3 sets - 10 reps - Standing Clam with Resistance Loop  - 1 x daily - 7 x weekly - 3 sets - 10 reps - Standing Gastroc Stretch at Counter  - 3 x daily - 7 x weekly - 1 sets - 3 reps - 20 hold - Standing Gastroc Stretch on Step with Counter Support  - 3 x daily - 7 x weekly - 1 sets - 3 reps - 20 hold - Forward Step Down Touch with Heel  - 1 x daily - 7 x weekly - 2 sets - 10 reps - Forward T with Counter Support  - 1 x daily - 7 x weekly - 1-2 sets - 10 reps ASSESSMENT:  CLINICAL IMPRESSION: Pt reports 100% resolution of low back and gluteal pain.  Pt with Rt knee pain that she reports as stiffness.  Gait pattern is altered due to to this.  She had MRI of the Rt knee and results show meniscus tear and edema in the medial/posterior aspect of the knee.  We discussed activity modification and worked on alignment with steps to normalize pattern.  Tactile and demo cues provided throughout session.  Patient will benefit from skilled PT to address the below impairments and improve overall function.    OBJECTIVE IMPAIRMENTS: decreased balance, decreased coordination, decreased mobility, decreased strength, increased muscle spasms,  impaired flexibility, improper body mechanics, postural dysfunction, and pain.   ACTIVITY LIMITATIONS: bending, sitting, standing, squatting, sleeping, and stairs  PARTICIPATION LIMITATIONS: cleaning, shopping, and community activity  PERSONAL FACTORS: 1 comorbidity: Hx of Lt meniscus Sx and ongoing Lt heel pain are also affecting patient's functional outcome.   REHAB POTENTIAL: Excellent  CLINICAL DECISION MAKING: Stable/uncomplicated  EVALUATION COMPLEXITY: Moderate   GOALS: Goals reviewed with patient? Yes  SHORT TERM GOALS: Target date: 07/05/23  Pt will be ind with initial HEP Baseline: Goal status: MET  2.  Pt will demo good alignment and body mechanics for functional squat Baseline:  Goal status: MET  3.  Pt will have full symmetrical Lt SB resulting from improved Rt QL flexibility Baseline: full lumbar A/ROM (07/12/23) Goal status: MET  4. Pt will learn aquatic program over 1-2 visits to perform ind.  Baseline: 1-2 more sessions to finalize (07/12/23)  Goal Status: In progress    LONG TERM GOALS: Target date: 08/02/23  Pt will be ind with advanced HEP Baseline: HEP has been advanced HEP (07/12/23) Goal status: In progress   2.  Pt will improve ODI score to </= 24% to demo improved function Baseline: 17/50, 34% Goal status: INITIAL  3.  Pt will report improved balance confidence in busy environments such as with her grandkids. Baseline:  Goal status: In progress   4.  Pt will achieve 5/5 bil LE strength to improve functional task performance throughout day. Baseline:  Goal status: INITIAL  5.  Pt will report at least 70% reduction of pain in Rt lumbar spine and Rt LE. Baseline: 100% (07/12/23) Goal status: MET    PLAN:  PT FREQUENCY: 2x/week  PT DURATION: 8 weeks  PLANNED INTERVENTIONS: 97110-Therapeutic exercises, 97530- Therapeutic activity, 97112- Neuromuscular re-education, 97535- Self Care, 16109- Manual therapy, [phone]- Aquatic Therapy, G0283-  Electrical stimulation (unattended), [id number]- Ultrasound, 09811- Traction (mechanical), [id number]- Ionotophoresis 4mg /ml Dexamethasone, Patient/Family education, Balance training, Dry Needling, Joint mobilization, Spinal mobilization, Cryotherapy, and Moist heat.  PLAN FOR NEXT SESSION:1-2 sessions of aquatics, continue strength and flexibility of the hip and knee.  Work on steps   [employer], PT 07/12/23 1:28 PM    [facility] [address] [place], [place] [address] Phone # [phone] Fax [phone]'

## 2023-07-13 ENCOUNTER — Encounter: Payer: Self-pay | Admitting: Family Medicine

## 2023-07-13 ENCOUNTER — Ambulatory Visit (INDEPENDENT_AMBULATORY_CARE_PROVIDER_SITE_OTHER): Admitting: Family Medicine

## 2023-07-13 ENCOUNTER — Ambulatory Visit: Payer: Self-pay | Admitting: Family Medicine

## 2023-07-13 VITALS — BP 162/84 | HR 77 | Temp 98.0°F | Ht 63.5 in | Wt 184.0 lb

## 2023-07-13 DIAGNOSIS — N184 Chronic kidney disease, stage 4 (severe): Secondary | ICD-10-CM

## 2023-07-13 DIAGNOSIS — F32A Depression, unspecified: Secondary | ICD-10-CM

## 2023-07-13 DIAGNOSIS — E6609 Other obesity due to excess calories: Secondary | ICD-10-CM | POA: Diagnosis not present

## 2023-07-13 DIAGNOSIS — I10 Essential (primary) hypertension: Secondary | ICD-10-CM

## 2023-07-13 DIAGNOSIS — Z6832 Body mass index (BMI) 32.0-32.9, adult: Secondary | ICD-10-CM

## 2023-07-13 DIAGNOSIS — I159 Secondary hypertension, unspecified: Secondary | ICD-10-CM

## 2023-07-13 DIAGNOSIS — E66811 Obesity, class 1: Secondary | ICD-10-CM | POA: Diagnosis not present

## 2023-07-13 DIAGNOSIS — F419 Anxiety disorder, unspecified: Secondary | ICD-10-CM

## 2023-07-13 DIAGNOSIS — N1832 Chronic kidney disease, stage 3b: Secondary | ICD-10-CM

## 2023-07-13 DIAGNOSIS — E1022 Type 1 diabetes mellitus with diabetic chronic kidney disease: Secondary | ICD-10-CM

## 2023-07-13 DIAGNOSIS — R5383 Other fatigue: Secondary | ICD-10-CM | POA: Diagnosis not present

## 2023-07-13 LAB — CBC WITH DIFFERENTIAL/PLATELET
Basophils Absolute: 0 10*3/uL (ref 0.0–0.1)
Basophils Relative: 0.8 % (ref 0.0–3.0)
Eosinophils Absolute: 0.1 10*3/uL (ref 0.0–0.7)
Eosinophils Relative: 2.3 % (ref 0.0–5.0)
HCT: 41.6 % (ref 36.0–46.0)
Hemoglobin: 13.9 g/dL (ref 12.0–15.0)
Lymphocytes Relative: 21.9 % (ref 12.0–46.0)
Lymphs Abs: 1.2 10*3/uL (ref 0.7–4.0)
MCHC: 33.4 g/dL (ref 30.0–36.0)
MCV: 90.4 fl (ref 78.0–100.0)
Monocytes Absolute: 0.3 10*3/uL (ref 0.1–1.0)
Monocytes Relative: 6 % (ref 3.0–12.0)
Neutro Abs: 3.6 10*3/uL (ref 1.4–7.7)
Neutrophils Relative %: 69 % (ref 43.0–77.0)
Platelets: 260 10*3/uL (ref 150.0–400.0)
RBC: 4.6 Mil/uL (ref 3.87–5.11)
RDW: 12.8 % (ref 11.5–15.5)
WBC: 5.3 10*3/uL (ref 4.0–10.5)

## 2023-07-13 LAB — COMPREHENSIVE METABOLIC PANEL WITH GFR
ALT: 13 U/L (ref 0–35)
AST: 15 U/L (ref 0–37)
Albumin: 4.6 g/dL (ref 3.5–5.2)
Alkaline Phosphatase: 77 U/L (ref 39–117)
BUN: 30 mg/dL — ABNORMAL HIGH (ref 6–23)
CO2: 26 meq/L (ref 19–32)
Calcium: 9.7 mg/dL (ref 8.4–10.5)
Chloride: 101 meq/L (ref 96–112)
Creatinine, Ser: 1.73 mg/dL — ABNORMAL HIGH (ref 0.40–1.20)
GFR: 29.68 mL/min — ABNORMAL LOW (ref >60.00)
Glucose, Bld: 115 mg/dL — ABNORMAL HIGH (ref 70–99)
Potassium: 4.1 meq/L (ref 3.5–5.1)
Sodium: 136 meq/L (ref 135–145)
Total Bilirubin: 0.6 mg/dL (ref 0.2–1.2)
Total Protein: 8 g/dL (ref 6.0–8.3)

## 2023-07-13 LAB — TSH: TSH: 0.85 u[IU]/mL (ref 0.35–5.50)

## 2023-07-13 NOTE — Patient Instructions (Signed)
-  Blood pressure is elevated today. Recommend to start monitoring your blood pressure twice a day, once in the AM and once in the PM. Continue to take Metoprolol  25mg  daily. Will obtain labs and make a decision to either start an blood pressure that can protect your kidneys or medication that will not effect your kidneys. -Recommend to increase Sertraline  to 25 mg tablet.  -Ordered labs. Office will call with results and you will see them on MyChart.  -Follow up in 2 weeks.

## 2023-07-13 NOTE — Progress Notes (Unsigned)
 Established Patient Office Visit   Subjective:  Patient ID: Jenna Caldwell, female    DOB: May 13, 1955  Age: 68 y.o. MRN: [id number]  Chief Complaint  Patient presents with   Medical Management of Chronic Issues    HPI HTN: Chronic. Patient is prescribed Metoprolol  XR 12.5mg  daily, but started taking a whole tablet back in December 2024. Last took this morning. On her last visit in November, suspected blood pressure was not controlled and recommended to increase medication then. She was going on a long trip to [place]  and noticed her BP staying consistently elevated. She reports she has not been eating as well, but maintaining regular exercise. Patient has not been monitoring her blood pressure. However, the last three visits within Epic have been elevated. Denies chest pain, SHOB, dizziness. Maybe a little lower extremity edema. Reports a daily headache in her temples, noticed while on her trip to [place]  and having a ringing in her ears.   BP Readings from Last 3 Encounters:  07/13/23 (!) 162/84  06/24/23 (!) 142/88  05/25/23 (!) 158/78    Depression/Anxiety: Chronic. Patient is prescribed Sertraline  25mg  daily. She is taking 1/2 tablet daily. Unknown when she switched this medication. She reports her stress level has been elevated due to retiring, living in a RV for the long trip, and her sister having cancer.  Denies SI or HI.  ROS See HPI above     Objective:   BP (!) 162/84   Pulse 77   Temp 98 F (36.7 C) (Oral)   Ht 5' 3.5" (1.613 m)   Wt 184 lb (83.5 kg)   SpO2 96%   BMI 32.08 kg/m    Physical Exam Vitals reviewed.  Constitutional:      General: She is not in acute distress.    Appearance: Normal appearance. She is obese. She is not ill-appearing, toxic-appearing or diaphoretic.  HENT:     Head: Normocephalic and atraumatic.  Eyes:     General:        Right eye: No discharge.        Left eye: No discharge.     Conjunctiva/sclera: Conjunctivae normal.   Cardiovascular:     Rate and Rhythm: Normal rate and regular rhythm.     Heart sounds: Normal heart sounds. No murmur heard.    No friction rub. No gallop.  Pulmonary:     Effort: Pulmonary effort is normal. No respiratory distress.     Breath sounds: Normal breath sounds.  Musculoskeletal:        General: Normal range of motion.     Right lower leg: No edema.     Left lower leg: No edema.  Skin:    General: Skin is warm and dry.  Neurological:     General: No focal deficit present.     Mental Status: She is alert and oriented to person, place, and time. Mental status is at baseline.  Psychiatric:        Mood and Affect: Mood normal.        Behavior: Behavior normal.        Thought Content: Thought content normal.        Judgment: Judgment normal.      Assessment & Plan:  Primary hypertension Assessment & Plan: Blood pressure is elevated today. Recommend to start monitoring her blood pressure twice a day, once in the AM and once in the PM. Continue to take Metoprolol  25mg  daily. Will obtain labs and make a decision to either  start an ARB or Amlodipine 2.5mg  daily. Ordered CMP.   Orders: -     Comprehensive metabolic panel with GFR  Anxiety and depression Assessment & Plan: Uncontrolled. Patient scored 7 on PHQ-9 and scored 9 on GAD-7. Recommend to increase Sertraline  to 25 mg tablet.    Stage 3b chronic kidney disease (HCC) -     Comprehensive metabolic panel with GFR  Class 1 obesity due to excess calories with serious comorbidity and body mass index (BMI) of 32.0 to 32.9 in adult -     CBC with Differential/Platelet -     TSH -     Comprehensive metabolic panel with GFR  Fatigue, unspecified type -     TSH  -Ordered CBC, TSH, and CMP based on BMI-obesity, CKD, and fatigue. Lipids not completed due to not fasting.  -Patient was referred back last year to [facility] but didn't go through with making an appointment since she was going on a long trip. Prior to  trip, she had reached out to them and the referral was good for a year.   Return in about 2 weeks (around 07/27/2023) for follow-up.   Jenna Buehrle, NP

## 2023-07-13 NOTE — Progress Notes (Signed)
 MRI shows mild arthritis and a small meniscus tear.  Injection could help.  If you are interested in that please return to clinic and we can talk about the MRI in full detail and consider an injection.  If you would like surgery consultation I can refer directly to orthopedic surgery if you would prefer.

## 2023-07-14 ENCOUNTER — Ambulatory Visit

## 2023-07-14 ENCOUNTER — Other Ambulatory Visit: Payer: Self-pay

## 2023-07-14 ENCOUNTER — Encounter: Payer: Self-pay | Admitting: Family Medicine

## 2023-07-14 ENCOUNTER — Ambulatory Visit (INDEPENDENT_AMBULATORY_CARE_PROVIDER_SITE_OTHER)

## 2023-07-14 ENCOUNTER — Ambulatory Visit (INDEPENDENT_AMBULATORY_CARE_PROVIDER_SITE_OTHER): Payer: Self-pay | Admitting: Family Medicine

## 2023-07-14 ENCOUNTER — Ambulatory Visit: Payer: Self-pay | Admitting: Family Medicine

## 2023-07-14 DIAGNOSIS — R7309 Other abnormal glucose: Secondary | ICD-10-CM

## 2023-07-14 DIAGNOSIS — M25561 Pain in right knee: Secondary | ICD-10-CM

## 2023-07-14 DIAGNOSIS — M5459 Other low back pain: Secondary | ICD-10-CM

## 2023-07-14 DIAGNOSIS — M6281 Muscle weakness (generalized): Secondary | ICD-10-CM | POA: Diagnosis not present

## 2023-07-14 DIAGNOSIS — R252 Cramp and spasm: Secondary | ICD-10-CM

## 2023-07-14 DIAGNOSIS — M25551 Pain in right hip: Secondary | ICD-10-CM

## 2023-07-14 DIAGNOSIS — Z0000 Encounter for general adult medical examination without abnormal findings: Secondary | ICD-10-CM

## 2023-07-14 LAB — HEMOGLOBIN A1C: Hgb A1c MFr Bld: 6.2 % (ref 4.6–6.5)

## 2023-07-14 MED ORDER — AMLODIPINE BESYLATE 2.5 MG PO TABS: 2.5000 mg | ORAL_TABLET | Freq: Every day | ORAL | 0 refills | Status: DC

## 2023-07-14 NOTE — Therapy (Addendum)
 OUTPATIENT PHYSICAL THERAPY THORACOLUMBAR TREATMENT NOTE   Patient Name: Jenna Caldwell MRN: [id number] DOB:[birth year], 68 y.o., female Today's Date: 07/14/2023   END OF SESSION:  PT End of Session - 07/14/23 1319     Visit Number 11    Date for PT Re-Evaluation 08/02/23    Authorization Type Medicare part A/B    Progress Note Due on Visit 20    PT Start Time 1233    PT Stop Time 1317    PT Time Calculation (min) 44 min    Activity Tolerance Patient tolerated treatment well    Behavior During Therapy WFL for tasks assessed/performed                    Past Medical History:  Diagnosis Date   Allergy    Sulfa and metformin severe reaction   Anxiety    Arthritis    Depression    Diabetes mellitus without complication (HCC) 2003   Pre diabetes   Hypertension    Osteopenia    Renal disease    Stage 3B CKD   Past Surgical History:  Procedure Laterality Date   ABLATION     Endometrial   APPENDECTOMY  1967   In sixth gradw   DILATION AND CURETTAGE OF UTERUS     KNEE SURGERY Left    Menisus   MOUTH SURGERY     08/2011   TONSILLECTOMY     Patient Active Problem List   Diagnosis Date Noted   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 06/24/2023   Chronic pain of right knee 06/24/2023   CKD (chronic kidney disease) stage 3, GFR 30-59 ml/min (HCC) 05/25/2023   Anxiety and depression 12/10/2022   Primary hypertension 12/10/2022    PCP: Rebecca Campus, NP  REFERRING PROVIDER: Syliva Even, MD  REFERRING DIAG: [phone] (ICD-10-CM) - Chronic pain of right knee M25.551 (ICD-10-CM) - Right hip pain M54.41,G89.29 (ICD-10-CM) - Chronic right-sided low back pain with right-sided sciatica  Rationale for Evaluation and Treatment: Rehabilitation  THERAPY DIAG:  Muscle weakness (generalized)  Cramp and spasm  Other low back pain  Pain in right hip  Acute pain of right knee  ONSET DATE: moved 5 mos ago and symptoms flared up - fell down  steps when carrying items  SUBJECTIVE:                                                                                                                                                                                           SUBJECTIVE STATEMENT:  I will see Dr [name] tomorrow to discuss my MRI results.  I have had increased  Rt hip and low back stiffness this morning.    Initial eval: Pt moved 5mos ago and during packing process fell down steps which started Rt hip pain.  Also traveled in new RV to [place] which exacerbated pain and had another fall (backwards) onto Rt hip. Have been in RV for several months.  Tight spaces make for difficult getting around at times.  This all made me feel like maybe I need some PT.  I am realizing I am better when I am up and moving. Will be here in [place] until later in the summer when we will go out on our RV again.  Pain starts in Rt low back in a specific place and travels down the leg (Pt outlines the L4 nerve root pattern). Worse in the AM and better with movement but yet I can overdo it. I am afraid of falling - my knees are shaky and didn't used to be.  PERTINENT HISTORY:  Lt meniscus surgery 2008  PAIN:  PAIN:  07/14/23 Are you having pain? Yes  NPRS scale: 4/10 Pain location: Rt knee  Pain orientation: Right  PAIN TYPE: stiff Pain description: constant but level fluctuates Aggravating factors: worse in the AM, standing and walking  Relieving factors: movement but not overdoing, wears knee brace on Rt   PRECAUTIONS: Fall  RED FLAGS: None   WEIGHT BEARING RESTRICTIONS: No  FALLS:  Has patient fallen in last 6 months? Yes. Number of falls 2 - fell down stairs carrying box, fell adjusting shoe on pool deck  LIVING ENVIRONMENT: Lives with: lives with their spouse Lives in: House/apartment Stairs: Yes: Internal: 4 levels steps;   and External: none from garage steps;   Has following equipment at home: None  OCCUPATION: retired  PLOF:  Independent  PATIENT GOALS: reduce pain in hip, improve balance and strength, improve balance confidence  NEXT MD VISIT: 2 weeks  OBJECTIVE:  Note: Objective measures were completed at Evaluation unless otherwise noted.  DIAGNOSTIC FINDINGS:  MRI Rt knee: 07/09/23:  IMPRESSION: Mild degenerative arthrosis with chondromalacia and small reactive joint effusion. Mild prepatellar edema and small prepatellar collection.   Likely tear of the posterior horn and posterior body of the medial meniscus. Small centrally displaced flap may be present best seen on coronal images 14 and 15 and sagittal image 13.   2025 xrays: Pelvic/Hip IMPRESSION: 1. Mild bilateral hip osteoarthritis, left-greater-than-right. 2. Moderate pubic symphysis osteoarthritis.  Rt knee IMPRESSION: Mild-to-moderate medial compartment and patellofemoral compartment osteoarthritis.  Lumbar: IMPRESSION: Degenerative joint changes of lumbar spine.  PATIENT SURVEYS:  Modified Oswestry 34%   COGNITION: Overall cognitive status: Within functional limits for tasks assessed     SENSATION: WFL  MUSCLE LENGTH:   POSTURE: increased lumbar lordosis  PALPATION: Very tender: Rt side gluteals, QL, greater trochanter  LUMBAR ROM:   AROM eval  Flexion Full, HS stretch end range  Extension Full no pain  Right lateral flexion full  Left lateral flexion 75% compared to Rt flexion, stretch on Rt side  Right rotation WNL, Rt pain  Left rotation WNL   (Blank rows = not tested)  LOWER EXTREMITY ROM:    Bil hips WNL  Passive  Right eval Left eval  Hip flexion    Hip extension    Hip abduction    Hip adduction    Hip internal rotation    Hip external rotation    Knee flexion 125 with pain 125  Knee extension    Ankle dorsiflexion  Ankle plantarflexion    Ankle inversion    Ankle eversion     (Blank rows = not tested)  LOWER EXTREMITY MMT:   4/5 Rt hip abd and ER Rt knee 4/5 with mild pain  inhibition   FUNCTIONAL TESTS:  Challenged in SLS balance bil LE Able to squat but unsteady   TREATMENT DATE:  07/14/23: Nustep x 6 min level 5 (PT present to discuss status) Seated figure 4 stretch 2x20 seconds  Standing rockerboard x2 min Sit to stand with 5# kettlebell; parallel feet and staggered stance x10 each  Forward T with min UE support on countertop x 10 each Trigger Point Dry Needling Subsequent Treatment: Instructions provided previously at initial dry needling treatment.   Patient Verbal Consent Given: Yes Education Handout Provided: Previously Provided Muscles Treated: Bil lumbar multifidi, Rt  and Rt gluteals  Electrical Stimulation Performed: No Treatment Response/Outcome: Utilized skilled palpation to identify bony landmarks and trigger points.  Able to illicit twitch response and muscle elongation.  Soft tissue mobilization to muscles needled following DN to further promote tissue elongation and decreased pain.  07/12/23: Nustep x 6 min level 5 (PT present to discuss status) Standing rockerboard x2 min Wall stretch with rockerboard x10 4" step down rt and Lt - work on alignment and form- multiple trials and discussion regarding technique Seated hamstring stretch 3x20 seconds  Sit to stand with 5# kettlebell; parallel feet and staggered stance x10 each  Forward T with min UE support on countertop   07/08/23: Pt arrives for aquatic physical therapy. Treatment took place in 3.5-5.5 feet of water. Water temperature was 91 degrees F. Pt entered the pool via stairs independently using stairs. Pt requires buoyancy of water for support and to offload joints with strengthening exercises.  Seated water bench with 75% submersion Pt performed seated LE AROM exercises 20x in all planes with concurrent review of pool hours and availablity. Review seated lymphatic massage for LE edema, performed 10x.  75% depth water walking with natural arms: 8x in each direction. LTLE calf  stretch 3x 30 sec. Bil heel raises 2x15. Bil hip 3 ways 15x each: VC for standing leg (RT > LT) to stand firmer to decompress through her knee. Seated decompression/LE hang 2 min f/b 3 min underwater bicycle on large noodle in horseback.  07/06/23: Pt arrives for aquatic physical therapy. Treatment took place in 3.5-5.5 feet of water. Water temperature was 91 degrees F. Pt entered the pool via stairs independently using stairs. Pt requires buoyancy of water for support and to offload joints with strengthening exercises.  Seated water bench with 75% submersion Pt performed seated LE AROM exercises 20x in all planes with concurrent verbal education on water principles, about the [facility] pool (rules/availability) and how to progress a basic water program. Pt verbally understood all concepts. Seated lymphatic massage for LE edema. Instructed pt how to do with correct return demo 10x. Instructed pt to try this 1x day to see if helps the swelling in her knees and lower leg.  75% depth water walking with natural arms: 6x in each direction. Bil heel raises 2x10. Bil hip 3 ways 15x each: VC for standing leg (RT > LT) to stand firmer to decompress through her knee. Practice bicycle in reclined position, pt has trouble controlling her middle (core).   06/27/23: Nustep x 6 min level 5 (PT present to discuss status) Standing rockerboard x2 min Up and down steps with forward facing posture, step-over step with use of rails Standing  hamstring stretch 3 x  30 sec- used power plate  Sit to stand with 5# kettlebell; parallel feet and staggered stance x10 each  Rockerboard against wall- gastroc stretch  Aquatic info issued  Trigger Point Dry Needling Subsequent Treatment: Instructions provided previously at initial dry needling treatment.   Patient Verbal Consent Given: Yes Education Handout Provided: Previously Provided Muscles Treated: Bil lumbar multifidi, Rt  and Lt gluteals  Electrical Stimulation Performed:  No Treatment Response/Outcome: Utilized skilled palpation to identify bony landmarks and trigger points.  Able to illicit twitch response and muscle elongation.  Soft tissue mobilization to muscles needled following DN to further promote tissue elongation and decreased pain.      PATIENT EDUCATION:  Education details: [id number], aquatic info and DN info given Person educated: Patient Education method: Explanation, Demonstration, Verbal cues, and Handouts Education comprehension: verbalized understanding and returned demonstration  HOME EXERCISE PROGRAM: Access Code: [email or web address] URL: [email or web address] Date: 07/12/2023 Prepared by: Loetta Ringer  Exercises - Supine Posterior Pelvic Tilt  - 1 x daily - 7 x weekly - 1 sets - 10 reps - Hooklying Single Knee to Chest Stretch  - 1 x daily - 7 x weekly - 1 sets - 10 reps - Supine Lower Trunk Rotation  - 1 x daily - 7 x weekly - 1 sets - 10 reps - Supine Double Knee to Chest  - 1 x daily - 7 x weekly - 1 sets - 10 reps - Seated Quadratus Lumborum Stretch in Chair  - 1 x daily - 7 x weekly - 1 sets - 2 reps - 20-30 hold - Seated Long Arc Quad  - 1 x daily - 7 x weekly - 1 sets - 10 reps - Clamshell  - 2 x daily - 7 x weekly - 2 sets - 10 reps - Sit to Stand Without Arm Support  - 2 x daily - 7 x weekly - 2 sets - 10 reps - Supine Hip Adduction Isometric with Ball  - 1 x daily - 7 x weekly - 2 sets - 10 reps - 5 hold - Side Stepping with Resistance at Ankles  - 1 x daily - 7 x weekly - 3 sets - 10 reps - Standing Clam with Resistance Loop  - 1 x daily - 7 x weekly - 3 sets - 10 reps - Standing Gastroc Stretch at Counter  - 3 x daily - 7 x weekly - 1 sets - 3 reps - 20 hold - Standing Gastroc Stretch on Step with Counter Support  - 3 x daily - 7 x weekly - 1 sets - 3 reps - 20 hold - Forward Step Down Touch with Heel  - 1 x daily - 7 x weekly - 2 sets - 10 reps - Forward T with Counter Support  - 1 x daily - 7 x weekly - 1-2 sets - 10  reps ASSESSMENT:  CLINICAL IMPRESSION: Pt reports increased tension in Rt lumbar and gluteal region today.  Good response to dry needling with improved tissue mobility and twitch response.  Pt will see MD tomorrow to discuss Rt knee MRI results.  Tactile and demo cues provided throughout session and monitored for pain.   Patient will benefit from skilled PT to address the below impairments and improve overall function.    OBJECTIVE IMPAIRMENTS: decreased balance, decreased coordination, decreased mobility, decreased strength, increased muscle spasms, impaired flexibility, improper body mechanics, postural dysfunction, and pain.   ACTIVITY LIMITATIONS: bending, sitting, standing, squatting, sleeping, and  stairs  PARTICIPATION LIMITATIONS: cleaning, shopping, and community activity  PERSONAL FACTORS: 1 comorbidity: Hx of Lt meniscus Sx and ongoing Lt heel pain are also affecting patient's functional outcome.   REHAB POTENTIAL: Excellent  CLINICAL DECISION MAKING: Stable/uncomplicated  EVALUATION COMPLEXITY: Moderate   GOALS: Goals reviewed with patient? Yes  SHORT TERM GOALS: Target date: 07/05/23  Pt will be ind with initial HEP Baseline: Goal status: MET  2.  Pt will demo good alignment and body mechanics for functional squat Baseline:  Goal status: MET  3.  Pt will have full symmetrical Lt SB resulting from improved Rt QL flexibility Baseline: full lumbar A/ROM (07/12/23) Goal status: MET  4. Pt will learn aquatic program over 1-2 visits to perform ind.  Baseline: 1-2 more sessions to finalize (07/12/23)  Goal Status: In progress    LONG TERM GOALS: Target date: 08/02/23  Pt will be ind with advanced HEP Baseline: HEP has been advanced HEP (07/12/23) Goal status: In progress   2.  Pt will improve ODI score to </= 24% to demo improved function Baseline: 17/50, 34% Goal status: INITIAL  3.  Pt will report improved balance confidence in busy environments such as with  her grandkids. Baseline:  Goal status: In progress   4.  Pt will achieve 5/5 bil LE strength to improve functional task performance throughout day. Baseline:  Goal status: INITIAL  5.  Pt will report at least 70% reduction of pain in Rt lumbar spine and Rt LE. Baseline: 100% (07/12/23) Goal status: MET    PLAN:  PT FREQUENCY: 2x/week  PT DURATION: 8 weeks  PLANNED INTERVENTIONS: 97110-Therapeutic exercises, 97530- Therapeutic activity, 97112- Neuromuscular re-education, 97535- Self Care, 16109- Manual therapy, [id number]- Aquatic Therapy, G0283- Electrical stimulation (unattended), [id number]- Ultrasound, 09811- Traction (mechanical), [id number]- Ionotophoresis 4mg /ml Dexamethasone, Patient/Family education, Balance training, Dry Needling, Joint mobilization, Spinal mobilization, Cryotherapy, and Moist heat.  PLAN FOR NEXT SESSION:1-2 sessions of aquatics, continue strength and flexibility of the hip and knee.  Work on steps   [employer], PT 07/14/23 1:25 PM    [facility] [address], [address] [place], [place] [address] Phone # [phone] Fax [phone]'

## 2023-07-14 NOTE — Patient Instructions (Signed)
 I really enjoyed getting to talk with you today! I am available on Tuesdays and Thursdays for virtual visits if you have any questions or concerns, or if I can be of any further assistance.   CHECKLIST FROM ANNUAL WELLNESS VISIT:  -Follow up (please call to schedule if not scheduled after visit):   -yearly for annual wellness visit with primary care office  Here is a list of your preventive care/health maintenance measures and the plan for each if any are due:  PLAN For any measures below that may be due:    1.) Please call to schedule your mammogram" [phone]   2.) please bring copies of the following to the office so that we can update your record:   -last colonoscopy report   -last bone density report   -receipt of las Tdap and your shingles vaccines  Health Maintenance  Topic Date Due   Diabetic kidney evaluation - Urine ACR  Never done   Hepatitis C Screening  Never done   DTaP/Tdap/Td (1 - Tdap) Never done   Colonoscopy  Never done   MAMMOGRAM  Never done   Zoster Vaccines- Shingrix (1 of 2) Never done   DEXA SCAN  Never done   COVID-19 Vaccine (5 - 2024-25 season) 10/17/2022   INFLUENZA VACCINE  09/16/2023   Diabetic kidney evaluation - eGFR measurement  07/12/2024   Medicare Annual Wellness (AWV)  07/13/2024   Pneumonia Vaccine 34+ Years old  Completed   HPV VACCINES  Aged Out   Meningococcal B Vaccine  Aged Out    -See a dentist at least yearly  -Get your eyes checked and then per your eye specialist's recommendations  -Other issues addressed today:   -I have included below further information regarding a healthy whole foods based diet, physical activity guidelines for adults, stress management and opportunities for social connections. I hope you find this information useful.    -----------------------------------------------------------------------------------------------------------------------------------------------------------------------------------------------------------------------------------------------------------    NUTRITION: -eat real food: lots of colorful vegetables (half the plate) and fruits -5-7 servings of vegetables and fruits per day (fresh or steamed is best), exp. 2 servings of vegetables with lunch and dinner and 2 servings of fruit per day. Berries and greens such as kale and collards are great choices.  -consume on a regular basis:  fresh fruits, fresh veggies, fish, nuts, seeds, healthy oils (such as olive oil, avocado oil), whole grains (make sure for bread/pasta/crackers/etc., that the first ingredient on label contains the word "whole"), legumes. -can eat small amounts of dairy and lean meat (no larger than the palm of your hand), but avoid processed meats such as ham, bacon, lunch meat, etc. -drink water -try to avoid fast food and pre-packaged foods, processed meat, ultra processed foods/beverages (donuts, candy, etc.) -most experts advise limiting sodium to < 2300mg  per day, should limit further is any chronic conditions such as high blood pressure, heart disease, diabetes, etc. The American Heart Association advised that < 1500mg  is is ideal -try to avoid foods/beverages that contain any ingredients with names you do not recognize  -try to avoid foods/beverages  with added sugar or sweeteners/sweets  -try to avoid sweet drinks (including diet drinks): soda, juice, Gatorade, sweet tea, power drinks, diet drinks -try to avoid white rice, white bread, pasta (unless whole grain)  EXERCISE GUIDELINES FOR ADULTS: -if you wish to increase your physical activity, do so gradually and with the approval of your doctor -STOP and seek medical care immediately if you have any chest pain, chest discomfort or  trouble breathing when starting or  increasing exercise  -move and stretch your body, legs, feet and arms when sitting for long periods -Physical activity guidelines for optimal health in adults: -get at least 150 minutes per week of moderate exercise (can talk, but not sing); this is about 20-30 minutes of sustained activity 5-7 days per week or two 10-15 minute episodes of sustained activity 5-7 days per week -do some muscle building/resistance training/strength training at least 2 days per week  -balance exercises 3+ days per week:   Stand somewhere where you have something sturdy to hold onto if you lose balance    1) lift up on toes, then back down, start with 5x per day and work up to 20x   2) stand and lift one leg straight out to the side so that foot is a few inches of the floor, start with 5x each side and work up to 20x each side   3) stand on one foot, start with 5 seconds each side and work up to 20 seconds on each side  If you need ideas or help with getting more active:  -Silver sneakers https://tools.silversneakers.com  -Walk with a Doc: http://www.duncan-williams.com/  -try to include resistance (weight lifting/strength building) and balance exercises twice per week: or the following link for ideas: http://castillo-powell.com/  BuyDucts.dk  STRESS MANAGEMENT: -can try meditating, or just sitting quietly with deep breathing while intentionally relaxing all parts of your body for 5 minutes daily -if you need further help with stress, anxiety or depression please follow up with your primary doctor or contact the wonderful folks at [facility]: [phone]  SOCIAL CONNECTIONS: -options in [place] if you wish to engage in more social and exercise related activities:  -Silver sneakers https://tools.silversneakers.com  -Walk with a Doc: http://www.duncan-williams.com/  -Check out the [facility] Active Adults 50+  section on the [place] of [employer] (hiking clubs, book clubs, cards and games, chess, exercise classes, aquatic classes and much more) - see the website for details: https://www.[place].gov/departments/parks-recreation/active-adults50  -YouTube has lots of exercise videos for different ages and abilities as well  -[facility] (a variety of indoor and outdoor inperson activities for adults). 9380707878. [address]  -Virtual Online Classes (a variety of topics): see seniorplanet.org or call [phone]  -consider volunteering at a school, hospice center, church, senior center or elsewhere

## 2023-07-14 NOTE — Progress Notes (Signed)
 Patient unable to obtain vital signs due to telehealth visit

## 2023-07-14 NOTE — Progress Notes (Unsigned)
 I, Jenna Caldwell am a scribe for Dr. [name], MD.  Jenna Caldwell is a 68 y.o. female who presents to [facility] at [facility] today for f/u R knee pain w/ MRI review. Pt was last seen by Dr. Alease Caldwell on 06/24/23 and was prescribed Valium  for knee MRI. Also advised to cont PT, completing 10 visits.  Today, pt reports knee hurts today. Taking PT has helped a lot and also did dry needling for the glutes.   She notes clicking and popping in the right posterior medial knee especially with full flexion and loading activities.  Dx imaging: 07/09/23 R knee MRI  05/25/23 R knee XR  Pertinent review of systems: No fevers or chills  Relevant historical information: History of left meniscus tear with surgery. Chronic kidney disease stage IV. Patient has a trip to [facility] in August.   Exam:  BP 122/60   Pulse 78   Ht 5' 3.5" (1.613 m)   Wt 188 lb 3.2 oz (85.4 kg)   SpO2 98%   BMI 32.82 kg/m  General: Well Developed, well nourished, and in no acute distress.   MSK: Right knee normal-appearing Tender palpation posterior medial knee.  Normal motion.  Positive McMurray's test.    Lab and Radiology Results  MR KNEE WITHOUT IV CONTRAST RIGHT   COMPARISON: None.   CLINICAL HISTORY: Chronic right knee pain.   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 There is no significant subchondral reactive edema. There is mild joint effusion. The extensor mechanism is intact. Small prepatellar collection and subcutaneous edema is identified.   Ligaments: The ACL, PCL, MCL and fibular collateral ligament are intact.   Menisci: Lateral meniscus is unremarkable. There is abnormal signal in the posterior horn and posterior body of the medial meniscus with suspicion of a small displaced tear. Correlation for medial  joint line symptoms.   IMPRESSION: Mild degenerative arthrosis with chondromalacia and small reactive joint effusion. Mild prepatellar edema and small prepatellar collection.   Likely tear of the posterior horn and posterior body of the medial meniscus. Small centrally displaced flap may be present best seen on coronal images 14 and 15 and sagittal image 13.   Electronically signed by: [name] MD 07/12/2023 10:58 AM EDT RP Workstation: [id number]  [name], personally (independently) visualized and performed the interpretation of the images attached in this note.    Chemistry      Component Value Date/Time   NA 136 07/13/2023 1147   K 4.1 07/13/2023 1147   CL 101 07/13/2023 1147   CO2 26 07/13/2023 1147   BUN 30 (H) 07/13/2023 1147   CREATININE 1.73 (H) 07/13/2023 1147      Component Value Date/Time   CALCIUM 9.7 07/13/2023 1147   ALKPHOS 77 07/13/2023 1147   AST 15 07/13/2023 1147   ALT 13 07/13/2023 1147   BILITOT 0.6 07/13/2023 1147         Assessment and Plan: 68 y.o. female with right posterior medial knee pain with mechanical symptoms.  She has a relatively unusual situation where she does not have much arthritis and is having mechanical pain that fits with a posterior medial meniscus tear near the meniscal root.  This is potentially repairable which may reduce the progression of arthritis.  Will refer to orthopedic surgery to discuss her surgical options.  She has an upcoming trip to [facility] where should be leaving at the end of July or early August.  If she does not have surgery right away she probably should wait until after that trip to have surgery.  Happy to do a cortisone shot towards the end of July before she leaves if needed.   PDMP not reviewed this encounter. Orders Placed This Encounter  Procedures   Ambulatory referral to Orthopedic Surgery    Referral Priority:   Routine    Referral Type:   Surgical    Referral Reason:    Specialty Services Required    Referred to Provider:   Wilhelmenia Harada, MD    Requested Specialty:   Orthopedic Surgery    Number of Visits Requested:   1   No orders of the defined types were placed in this encounter.    Discussed warning signs or symptoms. Please see discharge instructions. Patient expresses understanding.   The above documentation has been reviewed and is accurate and complete [name], M.D.

## 2023-07-14 NOTE — Assessment & Plan Note (Signed)
 Uncontrolled. Patient scored 7 on PHQ-9 and scored 9 on GAD-7. Recommend to increase Sertraline  to 25 mg tablet.

## 2023-07-14 NOTE — Addendum Note (Signed)
 Addended by: Teodoro Feller B on: 07/14/2023 10:26 AM   Modules accepted: Orders

## 2023-07-14 NOTE — Assessment & Plan Note (Signed)
 Blood pressure is elevated today. Recommend to start monitoring her blood pressure twice a day, once in the AM and once in the PM. Continue to take Metoprolol  25mg  daily. Will obtain labs and make a decision to either start an ARB or Amlodipine 2.5mg  daily. Ordered CMP.

## 2023-07-14 NOTE — Progress Notes (Signed)
 PATIENT CHECK-IN and HEALTH RISK ASSESSMENT QUESTIONNAIRE:  -completed by phone/video for upcoming Medicare Preventive Visit  Pre-Visit Check-in: 1)Vitals (height, wt, BP, etc) - record in vitals section for visit on day of visit Request home vitals (wt, BP, etc.) and enter into vitals, THEN update Vital Signs SmartPhrase below at the top of the HPI. See below.  2)Review and Update Medications, Allergies PMH, Surgeries, Social history in Epic 3)Hospitalizations in the last year with date/reason? no  4)Review and Update Care Team (patient's specialists) in Epic 5) Complete PHQ9 in Epic  6) Complete Fall Screening in Epic 7)Review all Health Maintenance Due and order under PCP if not done.  Medicare Wellness Patient Questionnaire:  Answer theses question about your habits: How often do you have a drink containing alcohol?2-3 monthly  How many drinks containing alcohol do you have on a typical day when you are drinking? 1  How often do you have six or more drinks on one occasion? N/a Have you ever smoked?no Quit date if applicable? N/a  How many packs a day do/did you smoke? N/A Do you use smokeless tobacco?No Do you use an illicit drugs?No On average, how many days per week do you engage in moderate to strenuous exercise (like a brisk walk)? 5 days  On average, how many minutes do you engage in exercise at this level? 40 mins  Are you sexually active?  Yes Number of partners? 1 She was eating super clean, but then when retired went on an RV trip and started eating poorly. Now she admits diet is poor. Plans to start doing better.  Typical breakfast: Varies   Typical lunch: Varies  Typical dinner: Varies  Typical snacks: chips   Beverages: Water and tea  Social connections, daughter and grand-kids are around - sees daughter or grandkids on regular basis  Answer theses question about your everyday activities: Can you perform most household chores?yes Are you deaf or have significant  trouble hearing?no Do you feel that you have a problem with memory?yes Do you feel safe at home? Yes  Last dentist visit? Fall 2024 8. Do you have any difficulty performing your everyday activities?no Are you having any difficulty walking, taking medications on your own, and or difficulty managing daily home needs?no Do you have difficulty walking or climbing stairs?yes Do you have difficulty dressing or bathing?no Do you have difficulty doing errands alone such as visiting a doctor's office or shopping?no Do you currently have any difficulty preparing food and eating?no Do you currently have any difficulty using the toilet?no Do you have any difficulty managing your finances?no Do you have any difficulties with housekeeping of managing your housekeeping?no   Do you have Advanced Directives in place (Living Will, Healthcare Power or Attorney)?  Yes    Last eye Exam and location? 10/2022   Do you currently use prescribed or non-prescribed narcotic or opioid pain medications? no  Do you have a history or close family history of breast, ovarian, tubal or peritoneal cancer or a family member with BRCA (breast cancer susceptibility 1 and 2) gene mutations? no  Nurse/Assistant Credentials/time stamp: mg 3:19    ----------------------------------------------------------------------------------------------------------------------------------------------------------------------------------------------------------------------  Because this visit was a virtual/telehealth visit, some criteria may be missing or patient reported. Any vitals not documented were not able to be obtained and vitals that have been documented are patient reported.    MEDICARE ANNUAL PREVENTIVE VISIT WITH PROVIDER: (Welcome to Medicare, initial annual wellness or annual wellness exam)  Virtual Visit via Video Note  I  connected with Jenna Caldwell on 07/14/23 by a video enabled telemedicine application and verified  that I am speaking with the correct person using two identifiers.  Location patient: home Location provider:work or home office Persons participating in the virtual visit: patient, provider  Concerns and/or follow up today: She just saw [place]. Started on new BP medication and will be seeing nephrologist. She also has concerns about her weight - wants to reduce weight.  Has had stress with a recent move, recent retirement, sister diagnosed with cancer - but she feels is doing ok.    See HM section in Epic for other details of completed HM.    ROS: negative for report of fevers, unintentional weight loss, vision changes, vision loss, hearing loss or change, chest pain, sob, hemoptysis, melena, hematochezia, hematuria, falls, bleeding or bruising, thoughts of suicide or self harm, memory loss  Patient-completed extensive health risk assessment - reviewed and discussed with the patient: See Health Risk Assessment completed with patient prior to the visit either above or in recent phone note. This was reviewed in detailed with the patient today and appropriate recommendations, orders and referrals were placed as needed per Summary below and patient instructions.   Review of Medical History: -PMH, PSH, Family History and current specialty and care providers reviewed and updated and listed below   Patient Care Team: Francenia Ingle, NP as PCP - General (Family Medicine) Sonny Dust, MD (Inactive) as PCP - Cardiology (Cardiology)   Past Medical History:  Diagnosis Date   Allergy    Sulfa and metformin severe reaction   Anxiety    Arthritis    Depression    Diabetes mellitus without complication (HCC) 2003   Pre diabetes   Hypertension    Osteopenia    Renal disease    Stage 3B CKD    Past Surgical History:  Procedure Laterality Date   ABLATION     Endometrial   APPENDECTOMY  1967   In sixth gradw   DILATION AND CURETTAGE OF UTERUS     KNEE SURGERY Left     Menisus   MOUTH SURGERY     08/2011   TONSILLECTOMY      Social History   Socioeconomic History   Marital status: Married    Spouse name: Not on file   Number of children: 3   Years of education: Not on file   Highest education level: Doctorate  Occupational History   Not on file  Tobacco Use   Smoking status: Never   Smokeless tobacco: Never  Vaping Use   Vaping status: Never Used  Substance and Sexual Activity   Alcohol use: Yes    Comment: Twice a month- 1 glass of wine   Drug use: Never   Sexual activity: Yes    Birth control/protection: Post-menopausal  Other Topics Concern   Not on file  Social History Narrative   Not on file   Social Drivers of Health   Financial Resource Strain: Low Risk  (07/12/2023)   Overall Financial Resource Strain (CARDIA)    Difficulty of Paying Living Expenses: Not hard at all  Food Insecurity: No Food Insecurity (07/12/2023)   Hunger Vital Sign    Worried About Running Out of Food in the Last Year: Never true    Ran Out of Food in the Last Year: Never true  Transportation Needs: No Transportation Needs (07/12/2023)   PRAPARE - Transportation    Lack of Transportation (Medical): No    Lack of  Transportation (Non-Medical): No  Physical Activity: Sufficiently Active (07/12/2023)   Exercise Vital Sign    Days of Exercise per Week: 5 days    Minutes of Exercise per Session: 40 min  Stress: Stress Concern Present (07/12/2023)   [employer] of Occupational Health - Occupational Stress Questionnaire    Feeling of Stress : Very much  Social Connections: Unknown (07/12/2023)   Social Connection and Isolation Panel [NHANES]    Frequency of Communication with Friends and Family: Twice a week    Frequency of Social Gatherings with Friends and Family: Patient declined    Attends Religious Services: More than 4 times per year    Active Member of [employer] or Organizations: Yes    Attends Banker Meetings: 1 to 4 times per year     Marital Status: Married  Catering manager Violence: Not At Risk (07/14/2023)   Humiliation, Afraid, Rape, and Kick questionnaire    Fear of Current or Ex-Partner: No    Emotionally Abused: No    Physically Abused: No    Sexually Abused: No    Family History  Problem Relation Age of Onset   Kidney disease Mother    Arthritis Mother    Depression Mother    Diabetes Mother    Heart disease Mother    Hypertension Mother    Heart attack Father    Emphysema Father    Diabetes Sister    Anxiety disorder Sister    Diabetes Sister    Heart attack Maternal Grandmother    Stroke Paternal Grandfather     Current Outpatient Medications on File Prior to Visit  Medication Sig Dispense Refill   amLODipine (NORVASC) 2.5 MG tablet Take 1 tablet (2.5 mg total) by mouth daily. 30 tablet 0   Calcium 200 MG TABS Take 2 tablets by mouth daily at 12 noon.     Cholecalciferol (VITAMIN D3 PO) Take 1 tablet by mouth daily at 12 noon.     Cyanocobalamin (VITAMIN B 12 PO) Take 1 tablet by mouth daily at 12 noon.     metoprolol  succinate (TOPROL -XL) 25 MG 24 hr tablet TAKE 1/2 TABLET(12.5 MG) BY MOUTH DAILY 45 tablet 0   tiZANidine  (ZANAFLEX ) 2 MG tablet Take 1-2 tablets (2-4 mg total) by mouth every 8 (eight) hours as needed. 60 tablet 1   sertraline  (ZOLOFT ) 50 MG tablet Take 0.5 tablets (25 mg total) by mouth daily. 45 tablet 1   No current facility-administered medications on file prior to visit.    Allergies  Allergen Reactions   Metformin Swelling   Metformin And Related    Sulfa Antibiotics    Sulfacetamide Hives and Rash    Sulfa Drugs       Physical Exam Vitals requested from patient and listed below if patient had equipment and was able to obtain at home for this virtual visit: There were no vitals filed for this visit. Estimated body mass index is 32.08 kg/m as calculated from the following:   Height as of 07/13/23: 5' 3.5" (1.613 m).   Weight as of 07/13/23: 184 lb (83.5  kg).  EKG (optional): deferred due to virtual visit  GENERAL: alert, oriented, no acute distress detected, full vision exam deferred due to pandemic and/or virtual encounter  HEENT: atraumatic, conjunttiva clear, no obvious abnormalities on inspection of external nose and ears  NECK: normal movements of the head and neck  LUNGS: on inspection no signs of respiratory distress, breathing rate appears normal, no obvious gross SOB,  gasping or wheezing  CV: no obvious cyanosis  MS: moves all visible extremities without noticeable abnormality  PSYCH/NEURO: pleasant and cooperative, no obvious depression or anxiety, speech and thought processing grossly intact, Cognitive function grossly intact  Flowsheet Row Clinical Support from 07/14/2023 in [facility]  PHQ-9 Total Score 7           07/14/2023    3:10 PM 07/13/2023   11:46 AM 07/13/2023   11:04 AM 12/10/2022   11:07 AM  Depression screen PHQ 2/9  Decreased Interest 0 0 0 0  Down, Depressed, Hopeless 1 1 0 1  PHQ - 2 Score 1 1 0 1  Altered sleeping 3 3 0 3  Tired, decreased energy 1 1 0 2  Change in appetite 2 1 0   Feeling bad or failure about yourself  0 1 0 1  Trouble concentrating 0 0 0 1  Moving slowly or fidgety/restless 0 0 0 0  Suicidal thoughts 0 0 0 0  PHQ-9 Score 7 7 0 8  Difficult doing work/chores Somewhat difficult Somewhat difficult Not difficult at all Somewhat difficult       12/10/2022   11:07 AM 12/20/2022    3:28 PM 07/09/2023    5:12 PM 07/13/2023    4:08 PM 07/14/2023    3:10 PM  Fall Risk  Falls in the past year? 0 0 1 1 1   Was there an injury with Fall? 0  1 0 0  Fall Risk Category Calculator 0  3  2  2   Patient at Risk for Falls Due to No Fall Risks    No Fall Risks  Fall risk Follow up Falls evaluation completed    Falls evaluation completed     Patient-reported     SUMMARY AND PLAN:  Encounter for Medicare annual wellness exam    Discussed applicable  health maintenance/preventive health measures and advised and referred or ordered per patient preferences: -thinks had dexa in early 2024 - she agrees to get copy and send  -reports last colonoscopy was in 2017 in [place] , she says is due in 2027, she agrees to bring copy to the office -she reports she did have the shingles vaccines and Tdap vaccines and will bring a copy of the report to the office -she agrees to schedule mammogram, provided number for GI Health Maintenance  Topic Date Due   Diabetic kidney evaluation - Urine ACR  Never done   Hepatitis C Screening  Never done   DTaP/Tdap/Td (1 - Tdap) Never done   Colonoscopy  Never done   MAMMOGRAM  Never done   Zoster Vaccines- Shingrix (1 of 2) Never done   DEXA SCAN  Never done   COVID-19 Vaccine (5 - 2024-25 season) 10/17/2022   INFLUENZA VACCINE  09/16/2023   Diabetic kidney evaluation - eGFR measurement  07/12/2024   Medicare Annual Wellness (AWV)  07/13/2024   Pneumonia Vaccine 47+ Years old  Completed   HPV VACCINES  Aged Out   Meningococcal B Vaccine  Aged Out      Education and counseling on the following was provided based on the above review of health and a plan/checklist for the patient, along with additional information discussed, was provided for the patient in the patient instructions :   -Provided counseling and plan for increased risk of falling if applicable per above screening. Provided safe balance exercises that can be done at home to improve balance and discussed exercise guidelines for adults  with include balance exercises at least 3 days per week.  -Advised and counseled on a healthy lifestyle - including the importance of a healthy diet, regular physical activity, social connections and stress management. Advised CBT and provided number for [facility]. -Reviewed patient's current diet. Advised and counseled on a whole foods based healthy diet with empharsis on healthy diet for her renal  disease stage. Potasium/electrolytes currently normal. A summary of a healthy diet was provided in the Patient Instructions.  -reviewed patient's current physical activity level and discussed exercise guidelines for adults. Discussed community resources and ideas for safe exercise at home to assist in meeting exercise guideline recommendations in a safe and healthy way.  -Advise yearly dental visits at minimum and regular eye exams -Advised and counseled on alcohol safe limits, risks Follow up: see patient instructions     Patient Instructions  I really enjoyed getting to talk with you today! I am available on Tuesdays and Thursdays for virtual visits if you have any questions or concerns, or if I can be of any further assistance.   CHECKLIST FROM ANNUAL WELLNESS VISIT:  -Follow up (please call to schedule if not scheduled after visit):   -yearly for annual wellness visit with primary care office  Here is a list of your preventive care/health maintenance measures and the plan for each if any are due:  PLAN For any measures below that may be due:    1.) Please call to schedule your mammogram" [phone]   2.) please bring copies of the following to the office so that we can update your record:   -last colonoscopy report   -last bone density report   -receipt of las Tdap and your shingles vaccines  Health Maintenance  Topic Date Due   Diabetic kidney evaluation - Urine ACR  Never done   Hepatitis C Screening  Never done   DTaP/Tdap/Td (1 - Tdap) Never done   Colonoscopy  Never done   MAMMOGRAM  Never done   Zoster Vaccines- Shingrix (1 of 2) Never done   DEXA SCAN  Never done   COVID-19 Vaccine (5 - 2024-25 season) 10/17/2022   INFLUENZA VACCINE  09/16/2023   Diabetic kidney evaluation - eGFR measurement  07/12/2024   Medicare Annual Wellness (AWV)  07/13/2024   Pneumonia Vaccine 40+ Years old  Completed   HPV VACCINES  Aged Out   Meningococcal B Vaccine  Aged Out     -See a dentist at least yearly  -Get your eyes checked and then per your eye specialist's recommendations  -Other issues addressed today:   -I have included below further information regarding a healthy whole foods based diet, physical activity guidelines for adults, stress management and opportunities for social connections. I hope you find this information useful.   -----------------------------------------------------------------------------------------------------------------------------------------------------------------------------------------------------------------------------------------------------------    NUTRITION: -eat real food: lots of colorful vegetables (half the plate) and fruits -5-7 servings of vegetables and fruits per day (fresh or steamed is best), exp. 2 servings of vegetables with lunch and dinner and 2 servings of fruit per day. Berries and greens such as kale and collards are great choices.  -consume on a regular basis:  fresh fruits, fresh veggies, fish, nuts, seeds, healthy oils (such as olive oil, avocado oil), whole grains (make sure for bread/pasta/crackers/etc., that the first ingredient on label contains the word "whole"), legumes. -can eat small amounts of dairy and lean meat (no larger than the palm of your hand), but avoid processed meats such as ham, bacon,  lunch meat, etc. -drink water -try to avoid fast food and pre-packaged foods, processed meat, ultra processed foods/beverages (donuts, candy, etc.) -most experts advise limiting sodium to < 2300mg  per day, should limit further is any chronic conditions such as high blood pressure, heart disease, diabetes, etc. The American Heart Association advised that < 1500mg  is is ideal -try to avoid foods/beverages that contain any ingredients with names you do not recognize  -try to avoid foods/beverages  with added sugar or sweeteners/sweets  -try to avoid sweet drinks (including diet drinks): soda,  juice, Gatorade, sweet tea, power drinks, diet drinks -try to avoid white rice, white bread, pasta (unless whole grain)  EXERCISE GUIDELINES FOR ADULTS: -if you wish to increase your physical activity, do so gradually and with the approval of your doctor -STOP and seek medical care immediately if you have any chest pain, chest discomfort or trouble breathing when starting or increasing exercise  -move and stretch your body, legs, feet and arms when sitting for long periods -Physical activity guidelines for optimal health in adults: -get at least 150 minutes per week of moderate exercise (can talk, but not sing); this is about 20-30 minutes of sustained activity 5-7 days per week or two 10-15 minute episodes of sustained activity 5-7 days per week -do some muscle building/resistance training/strength training at least 2 days per week  -balance exercises 3+ days per week:   Stand somewhere where you have something sturdy to hold onto if you lose balance    1) lift up on toes, then back down, start with 5x per day and work up to 20x   2) stand and lift one leg straight out to the side so that foot is a few inches of the floor, start with 5x each side and work up to 20x each side   3) stand on one foot, start with 5 seconds each side and work up to 20 seconds on each side  If you need ideas or help with getting more active:  -Silver sneakers https://tools.silversneakers.com  -Walk with a Doc: http://www.duncan-williams.com/  -try to include resistance (weight lifting/strength building) and balance exercises twice per week: or the following link for ideas: http://castillo-powell.com/  BuyDucts.dk  STRESS MANAGEMENT: -can try meditating, or just sitting quietly with deep breathing while intentionally relaxing all parts of your body for 5 minutes daily -if you need further help with stress, anxiety or  depression please follow up with your primary doctor or contact the wonderful folks at [facility]: [phone]  SOCIAL CONNECTIONS: -options in [place] if you wish to engage in more social and exercise related activities:  -Silver sneakers https://tools.silversneakers.com  -Walk with a Doc: http://www.duncan-williams.com/  -Check out the [facility] Active Adults 50+ section on the [place] [employer] (hiking clubs, book clubs, cards and games, chess, exercise classes, aquatic classes and much more) - see the website for details: https://www.[place].gov/departments/parks-recreation/active-adults50  -YouTube has lots of exercise videos for different ages and abilities as well  -[facility] (a variety of indoor and outdoor inperson activities for adults). [phone]. [address]  -Virtual Online Classes (a variety of topics): see seniorplanet.org or call [phone]  -consider volunteering at a school, hospice center, church, senior center or elsewhere            Maurie Southern, DO

## 2023-07-15 ENCOUNTER — Ambulatory Visit: Admitting: Family Medicine

## 2023-07-15 VITALS — BP 122/60 | HR 78 | Ht 63.5 in | Wt 188.2 lb

## 2023-07-15 DIAGNOSIS — M25561 Pain in right knee: Secondary | ICD-10-CM | POA: Diagnosis not present

## 2023-07-15 DIAGNOSIS — S83241A Other tear of medial meniscus, current injury, right knee, initial encounter: Secondary | ICD-10-CM | POA: Diagnosis not present

## 2023-07-15 DIAGNOSIS — G8929 Other chronic pain: Secondary | ICD-10-CM | POA: Diagnosis not present

## 2023-07-18 ENCOUNTER — Ambulatory Visit: Attending: Family Medicine

## 2023-07-18 ENCOUNTER — Telehealth: Payer: Self-pay | Admitting: Family Medicine

## 2023-07-18 DIAGNOSIS — R262 Difficulty in walking, not elsewhere classified: Secondary | ICD-10-CM | POA: Diagnosis present

## 2023-07-18 DIAGNOSIS — M25561 Pain in right knee: Secondary | ICD-10-CM

## 2023-07-18 DIAGNOSIS — M6281 Muscle weakness (generalized): Secondary | ICD-10-CM

## 2023-07-18 DIAGNOSIS — R252 Cramp and spasm: Secondary | ICD-10-CM

## 2023-07-18 DIAGNOSIS — M5459 Other low back pain: Secondary | ICD-10-CM | POA: Diagnosis present

## 2023-07-18 DIAGNOSIS — M25551 Pain in right hip: Secondary | ICD-10-CM

## 2023-07-18 NOTE — Telephone Encounter (Signed)
 Jenna Caldwell, referral was just placed on 07/15/23. Can we update provider / location in current referral or should be place a new referral?

## 2023-07-18 NOTE — Telephone Encounter (Signed)
 Patient called and asked if she could be referred to Dr. Lucienne Ryder instead of Dr. Hermina Loosen. He was recommended by a friend to her. She would like for us  to let her know when that new referral has been sent over.  Please Advise.

## 2023-07-18 NOTE — Therapy (Signed)
 OUTPATIENT PHYSICAL THERAPY THORACOLUMBAR TREATMENT NOTE   Patient Name: Jenna Caldwell MRN: [id number] DOB:April 25, 1955, 68 y.o., female Today's Date: 07/18/2023   END OF SESSION:  PT End of Session - 07/18/23 1310     Visit Number 12    Date for PT Re-Evaluation 08/02/23    Authorization Type Medicare part A/B    Progress Note Due on Visit 20    PT Start Time 1232    PT Stop Time 1313    PT Time Calculation (min) 41 min    Activity Tolerance Patient tolerated treatment well    Behavior During Therapy WFL for tasks assessed/performed                     Past Medical History:  Diagnosis Date   Allergy    Sulfa and metformin severe reaction   Anxiety    Arthritis    Depression    Diabetes mellitus without complication (HCC) 2003   Pre diabetes   Hypertension    Osteopenia    Renal disease    Stage 3B CKD   Past Surgical History:  Procedure Laterality Date   ABLATION     Endometrial   APPENDECTOMY  1967   In sixth gradw   DILATION AND CURETTAGE OF UTERUS     KNEE SURGERY Left    Menisus   MOUTH SURGERY     08/2011   TONSILLECTOMY     Patient Active Problem List   Diagnosis Date Noted   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 06/24/2023   Chronic pain of right knee 06/24/2023   CKD (chronic kidney disease) stage 3, GFR 30-59 ml/min (HCC) 05/25/2023   Anxiety and depression 12/10/2022   Primary hypertension 12/10/2022    PCP: Rebecca Campus, NP  REFERRING PROVIDER: Syliva Even, MD  REFERRING DIAG: [phone] (ICD-10-CM) - Chronic pain of right knee M25.551 (ICD-10-CM) - Right hip pain M54.41,G89.29 (ICD-10-CM) - Chronic right-sided low back pain with right-sided sciatica  Rationale for Evaluation and Treatment: Rehabilitation  THERAPY DIAG:  Muscle weakness (generalized)  Cramp and spasm  Other low back pain  Pain in right hip  Acute pain of right knee  ONSET DATE: moved 5 mos ago and symptoms flared up - fell down  steps when carrying items  SUBJECTIVE:                                                                                                                                                                                           SUBJECTIVE STATEMENT:  I saw the MD and he has referred me to see a surgeon to address  meniscus tear.  He will do an injection before my trip in August.  Needling helped me last time and everything loosened.   Initial eval: Pt moved 5mos ago and during packing process fell down steps which started Rt hip pain.  Also traveled in new RV to [place] which exacerbated pain and had another fall (backwards) onto Rt hip. Have been in RV for several months.  Tight spaces make for difficult getting around at times.  This all made me feel like maybe I need some PT.  I am realizing I am better when I am up and moving. Will be here in [place] until later in the summer when we will go out on our RV again.  Pain starts in Rt low back in a specific place and travels down the leg (Pt outlines the L4 nerve root pattern). Worse in the AM and better with movement but yet I can overdo it. I am afraid of falling - my knees are shaky and didn't used to be.  PERTINENT HISTORY:  Lt meniscus surgery 2008  PAIN:  PAIN:  07/18/23 Are you having pain? Yes  NPRS scale: 3/10 Pain location: Rt knee  Pain orientation: Right  PAIN TYPE: stiff Pain description: constant but level fluctuates Aggravating factors: worse in the AM, standing and walking  Relieving factors: movement but not overdoing, wears knee brace on Rt   PRECAUTIONS: Fall  RED FLAGS: None   WEIGHT BEARING RESTRICTIONS: No  FALLS:  Has patient fallen in last 6 months? Yes. Number of falls 2 - fell down stairs carrying box, fell adjusting shoe on pool deck  LIVING ENVIRONMENT: Lives with: lives with their spouse Lives in: House/apartment Stairs: Yes: Internal: 4 levels steps;   and External: none from garage steps;   Has  following equipment at home: None  OCCUPATION: retired  PLOF: Independent  PATIENT GOALS: reduce pain in hip, improve balance and strength, improve balance confidence  NEXT MD VISIT: 2 weeks  OBJECTIVE:  Note: Objective measures were completed at Evaluation unless otherwise noted.  DIAGNOSTIC FINDINGS:  MRI Rt knee: 07/09/23:  IMPRESSION: Mild degenerative arthrosis with chondromalacia and small reactive joint effusion. Mild prepatellar edema and small prepatellar collection.   Likely tear of the posterior horn and posterior body of the medial meniscus. Small centrally displaced flap may be present best seen on coronal images 14 and 15 and sagittal image 13.   2025 xrays: Pelvic/Hip IMPRESSION: 1. Mild bilateral hip osteoarthritis, left-greater-than-right. 2. Moderate pubic symphysis osteoarthritis.  Rt knee IMPRESSION: Mild-to-moderate medial compartment and patellofemoral compartment osteoarthritis.  Lumbar: IMPRESSION: Degenerative joint changes of lumbar spine.  PATIENT SURVEYS:  Modified Oswestry 34%   COGNITION: Overall cognitive status: Within functional limits for tasks assessed     SENSATION: WFL  MUSCLE LENGTH:   POSTURE: increased lumbar lordosis  PALPATION: Very tender: Rt side gluteals, QL, greater trochanter  LUMBAR ROM:   AROM eval  Flexion Full, HS stretch end range  Extension Full no pain  Right lateral flexion full  Left lateral flexion 75% compared to Rt flexion, stretch on Rt side  Right rotation WNL, Rt pain  Left rotation WNL   (Blank rows = not tested)  LOWER EXTREMITY ROM:    Bil hips WNL  Passive  Right eval Left eval  Hip flexion    Hip extension    Hip abduction    Hip adduction    Hip internal rotation    Hip external rotation    Knee flexion 125  with pain 125  Knee extension    Ankle dorsiflexion    Ankle plantarflexion    Ankle inversion    Ankle eversion     (Blank rows = not tested)  LOWER EXTREMITY  MMT:   4/5 Rt hip abd and ER Rt knee 4/5 with mild pain inhibition   FUNCTIONAL TESTS:  Challenged in SLS balance bil LE Able to squat but unsteady   TREATMENT DATE:   07/18/23: Nustep x 6 min level 5 (PT present to discuss status) Standing rockerboard x3 min Leg press: seat 6 60# 2x10, 40# Rt and Lt 2x10 each  Wall stretch with rockerboard x10 4" step down and 6" rt and Lt - work on alignment and form- significant improved technique Seated hamstring stretch 3x20 seconds  Resisted walking: 10# 4 ways x 6 each   07/14/23: Nustep x 6 min level 5 (PT present to discuss status) Seated figure 4 stretch 2x20 seconds  Standing rockerboard x2 min Sit to stand with 5# kettlebell; parallel feet and staggered stance x10 each  Forward T with min UE support on countertop x 10 each Trigger Point Dry Needling Subsequent Treatment: Instructions provided previously at initial dry needling treatment.   Patient Verbal Consent Given: Yes Education Handout Provided: Previously Provided Muscles Treated: Bil lumbar multifidi, Rt  and Rt gluteals  Electrical Stimulation Performed: No Treatment Response/Outcome: Utilized skilled palpation to identify bony landmarks and trigger points.  Able to illicit twitch response and muscle elongation.  Soft tissue mobilization to muscles needled following DN to further promote tissue elongation and decreased pain.  PATIENT EDUCATION:  Education details: [id number], aquatic info and DN info given Person educated: Patient Education method: Explanation, Demonstration, Verbal cues, and Handouts Education comprehension: verbalized understanding and returned demonstration  HOME EXERCISE PROGRAM: Access Code: [email or web address] URL: [email or web address] Date: 07/12/2023 Prepared by: Loetta Ringer  Exercises - Supine Posterior Pelvic Tilt  - 1 x daily - 7 x weekly - 1 sets - 10 reps - Hooklying Single Knee to Chest Stretch  - 1 x daily - 7 x weekly - 1 sets - 10  reps - Supine Lower Trunk Rotation  - 1 x daily - 7 x weekly - 1 sets - 10 reps - Supine Double Knee to Chest  - 1 x daily - 7 x weekly - 1 sets - 10 reps - Seated Quadratus Lumborum Stretch in Chair  - 1 x daily - 7 x weekly - 1 sets - 2 reps - 20-30 hold - Seated Long Arc Quad  - 1 x daily - 7 x weekly - 1 sets - 10 reps - Clamshell  - 2 x daily - 7 x weekly - 2 sets - 10 reps - Sit to Stand Without Arm Support  - 2 x daily - 7 x weekly - 2 sets - 10 reps - Supine Hip Adduction Isometric with Ball  - 1 x daily - 7 x weekly - 2 sets - 10 reps - 5 hold - Side Stepping with Resistance at Ankles  - 1 x daily - 7 x weekly - 3 sets - 10 reps - Standing Clam with Resistance Loop  - 1 x daily - 7 x weekly - 3 sets - 10 reps - Standing Gastroc Stretch at Counter  - 3 x daily - 7 x weekly - 1 sets - 3 reps - 20 hold - Standing Gastroc Stretch on Step with Counter Support  - 3 x daily - 7 x weekly - 1 sets -  3 reps - 20 hold - Forward Step Down Touch with Heel  - 1 x daily - 7 x weekly - 2 sets - 10 reps - Forward T with Counter Support  - 1 x daily - 7 x weekly - 1-2 sets - 10 reps ASSESSMENT:  CLINICAL IMPRESSION: Pt had good response to dry needling last session and Rt lumbar/gluteal symptoms have resolved. Pt is being referred to an orthopedic surgeon to discuss options and will receive a steroid injection before she leaves for a trip in August as needed.  PT instructed pt in leg press to do at the gym.  She is working on steps at home and reports this is easier at times. She demonstrated significant improvement in negotiating steps due to improved Lt gastroc length.  Tactile and demo cues provided throughout session and monitored for pain.   Patient will benefit from skilled PT to address the below impairments and improve overall function.    OBJECTIVE IMPAIRMENTS: decreased balance, decreased coordination, decreased mobility, decreased strength, increased muscle spasms, impaired flexibility,  improper body mechanics, postural dysfunction, and pain.   ACTIVITY LIMITATIONS: bending, sitting, standing, squatting, sleeping, and stairs  PARTICIPATION LIMITATIONS: cleaning, shopping, and community activity  PERSONAL FACTORS: 1 comorbidity: Hx of Lt meniscus Sx and ongoing Lt heel pain are also affecting patient's functional outcome.   REHAB POTENTIAL: Excellent  CLINICAL DECISION MAKING: Stable/uncomplicated  EVALUATION COMPLEXITY: Moderate   GOALS: Goals reviewed with patient? Yes  SHORT TERM GOALS: Target date: 07/05/23  Pt will be ind with initial HEP Baseline: Goal status: MET  2.  Pt will demo good alignment and body mechanics for functional squat Baseline:  Goal status: MET  3.  Pt will have full symmetrical Lt SB resulting from improved Rt QL flexibility Baseline: full lumbar A/ROM (07/12/23) Goal status: MET  4. Pt will learn aquatic program over 1-2 visits to perform ind.  Baseline: (07/18/23)  Goal Status: MET    LONG TERM GOALS: Target date: 08/02/23  Pt will be ind with advanced HEP Baseline: HEP has been advanced HEP (07/12/23) Goal status: In progress   2.  Pt will improve ODI score to </= 24% to demo improved function Baseline: 17/50, 34% Goal status: INITIAL  3.  Pt will report improved balance confidence in busy environments such as with her grandkids. Baseline:  Goal status: In progress   4.  Pt will achieve 5/5 bil LE strength to improve functional task performance throughout day. Baseline:  Goal status: INITIAL  5.  Pt will report at least 70% reduction of pain in Rt lumbar spine and Rt LE. Baseline: 100% (07/12/23) Goal status: MET    PLAN:  PT FREQUENCY: 2x/week  PT DURATION: 8 weeks  PLANNED INTERVENTIONS: 97110-Therapeutic exercises, 97530- Therapeutic activity, 97112- Neuromuscular re-education, 97535- Self Care, 16109- Manual therapy, [phone]- Aquatic Therapy, G0283- Electrical stimulation (unattended), [id number]- Ultrasound,  09811- Traction (mechanical), [id number]- Ionotophoresis 4mg /ml Dexamethasone, Patient/Family education, Balance training, Dry Needling, Joint mobilization, Spinal mobilization, Cryotherapy, and Moist heat.  PLAN FOR NEXT SESSION:steps, stability,  continue strength and flexibility of the hip and knee.  Work on steps   [employer] [facility], PT 07/18/23 1:14 PM    [facility] [address] [place], [place] [address] Phone # [phone] Fax [phone]'

## 2023-07-19 NOTE — Telephone Encounter (Signed)
 Location / provider updated.   Correct, Dr. Lucienne Ryder is with [facility].

## 2023-07-21 ENCOUNTER — Encounter

## 2023-07-22 ENCOUNTER — Ambulatory Visit: Admitting: Physical Therapy

## 2023-07-25 ENCOUNTER — Ambulatory Visit

## 2023-07-25 DIAGNOSIS — M6281 Muscle weakness (generalized): Secondary | ICD-10-CM | POA: Diagnosis not present

## 2023-07-25 DIAGNOSIS — M5459 Other low back pain: Secondary | ICD-10-CM

## 2023-07-25 DIAGNOSIS — R262 Difficulty in walking, not elsewhere classified: Secondary | ICD-10-CM

## 2023-07-25 DIAGNOSIS — R252 Cramp and spasm: Secondary | ICD-10-CM

## 2023-07-25 DIAGNOSIS — M25551 Pain in right hip: Secondary | ICD-10-CM

## 2023-07-25 DIAGNOSIS — M25561 Pain in right knee: Secondary | ICD-10-CM

## 2023-07-25 NOTE — Therapy (Signed)
 OUTPATIENT PHYSICAL THERAPY THORACOLUMBAR TREATMENT NOTE   Patient Name: Jenna Caldwell MRN: [id number] DOB:[birth year], 68 y.o., female Today's Date: 07/25/2023   END OF SESSION:  PT End of Session - 07/25/23 1317     Visit Number 13    Date for PT Re-Evaluation 08/02/23    Authorization Type Medicare part A/B    Progress Note Due on Visit 20    PT Start Time 1230    PT Stop Time 1315    PT Time Calculation (min) 45 min    Activity Tolerance Patient tolerated treatment well    Behavior During Therapy WFL for tasks assessed/performed                      Past Medical History:  Diagnosis Date   Allergy    Sulfa and metformin severe reaction   Anxiety    Arthritis    Depression    Diabetes mellitus without complication (HCC) 2003   Pre diabetes   Hypertension    Osteopenia    Renal disease    Stage 3B CKD   Past Surgical History:  Procedure Laterality Date   ABLATION     Endometrial   APPENDECTOMY  1967   In sixth gradw   DILATION AND CURETTAGE OF UTERUS     KNEE SURGERY Left    Menisus   MOUTH SURGERY     08/2011   TONSILLECTOMY     Patient Active Problem List   Diagnosis Date Noted   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 06/24/2023   Chronic pain of right knee 06/24/2023   CKD (chronic kidney disease) stage 3, GFR 30-59 ml/min (HCC) 05/25/2023   Anxiety and depression 12/10/2022   Primary hypertension 12/10/2022    PCP: Rebecca Campus, NP  REFERRING PROVIDER: Syliva Even, MD  REFERRING DIAG: [phone] (ICD-10-CM) - Chronic pain of right knee M25.551 (ICD-10-CM) - Right hip pain M54.41,G89.29 (ICD-10-CM) - Chronic right-sided low back pain with right-sided sciatica  Rationale for Evaluation and Treatment: Rehabilitation  THERAPY DIAG:  Muscle weakness (generalized)  Cramp and spasm  Other low back pain  Pain in right hip  Acute pain of right knee  Difficulty in walking, not elsewhere classified  ONSET DATE:  moved 5 mos ago and symptoms flared up - fell down steps when carrying items  SUBJECTIVE:                                                                                                                                                                                           SUBJECTIVE STATEMENT:  I was very busy over the weekend.  My Rt leg feels tired when I am more active and I feel it in the medial knee.    Initial eval: Pt moved 5mos ago and during packing process fell down steps which started Rt hip pain.  Also traveled in new RV to [place] which exacerbated pain and had another fall (backwards) onto Rt hip. Have been in RV for several months.  Tight spaces make for difficult getting around at times.  This all made me feel like maybe I need some PT.  I am realizing I am better when I am up and moving. Will be here in [place] until later in the summer when we will go out on our RV again.  Pain starts in Rt low back in a specific place and travels down the leg (Pt outlines the L4 nerve root pattern). Worse in the AM and better with movement but yet I can overdo it. I am afraid of falling - my knees are shaky and didn't used to be.  PERTINENT HISTORY:  Lt meniscus surgery 2008  PAIN:  PAIN:  07/25/23 Are you having pain? Yes  NPRS scale: 1/10 Pain location: Rt knee  Pain orientation: Right  PAIN TYPE: stiff, aching Pain description: constant but level fluctuates Aggravating factors: worse in the AM, standing and walking  Relieving factors: movement but not overdoing, wears knee brace on Rt   PRECAUTIONS: Fall  RED FLAGS: None   WEIGHT BEARING RESTRICTIONS: No  FALLS:  Has patient fallen in last 6 months? Yes. Number of falls 2 - fell down stairs carrying box, fell adjusting shoe on pool deck  LIVING ENVIRONMENT: Lives with: lives with their spouse Lives in: House/apartment Stairs: Yes: Internal: 4 levels steps;   and External: none from garage steps;   Has following  equipment at home: None  OCCUPATION: retired  PLOF: Independent  PATIENT GOALS: reduce pain in hip, improve balance and strength, improve balance confidence  NEXT MD VISIT: 2 weeks  OBJECTIVE:  Note: Objective measures were completed at Evaluation unless otherwise noted.  DIAGNOSTIC FINDINGS:  MRI Rt knee: 07/09/23:  IMPRESSION: Mild degenerative arthrosis with chondromalacia and small reactive joint effusion. Mild prepatellar edema and small prepatellar collection.   Likely tear of the posterior horn and posterior body of the medial meniscus. Small centrally displaced flap may be present best seen on coronal images 14 and 15 and sagittal image 13.   2025 xrays: Pelvic/Hip IMPRESSION: 1. Mild bilateral hip osteoarthritis, left-greater-than-right. 2. Moderate pubic symphysis osteoarthritis.  Rt knee IMPRESSION: Mild-to-moderate medial compartment and patellofemoral compartment osteoarthritis.  Lumbar: IMPRESSION: Degenerative joint changes of lumbar spine.  PATIENT SURVEYS:  Modified Oswestry 34%   COGNITION: Overall cognitive status: Within functional limits for tasks assessed     SENSATION: WFL  MUSCLE LENGTH:   POSTURE: increased lumbar lordosis  PALPATION: Very tender: Rt side gluteals, QL, greater trochanter  LUMBAR ROM:   AROM eval  Flexion Full, HS stretch end range  Extension Full no pain  Right lateral flexion full  Left lateral flexion 75% compared to Rt flexion, stretch on Rt side  Right rotation WNL, Rt pain  Left rotation WNL   (Blank rows = not tested)  LOWER EXTREMITY ROM:    Bil hips WNL  Passive  Right eval Left eval  Hip flexion    Hip extension    Hip abduction    Hip adduction    Hip internal rotation    Hip external rotation    Knee flexion 125 with pain  125  Knee extension    Ankle dorsiflexion    Ankle plantarflexion    Ankle inversion    Ankle eversion     (Blank rows = not tested)  LOWER EXTREMITY MMT:   4/5  Rt hip abd and ER Rt knee 4/5 with mild pain inhibition   FUNCTIONAL TESTS:  Challenged in SLS balance bil LE Able to squat but unsteady   TREATMENT DATE:   07/25/23: Nustep x 6 min level 5 (PT present to discuss status) Standing rockerboard x3 min Leg press: seat 6 60# 2x10, 40# Rt and Lt 2x10 each  Standing rockerboard x2 min  4" step down and 6" Rt and Lt - work on alignment and form- significant improved technique Seated hamstring stretch 3x20 seconds  Resisted walking: 10# 4 ways x 6 each  Gastroc stretch using edge of step 3x20 seconds bil   07/18/23: Nustep x 6 min level 5 (PT present to discuss status) Standing rockerboard x3 min Leg press: seat 6 60# 2x10, 40# Rt and Lt 2x10 each  Wall stretch with rockerboard x10 4" step down and 6" rt and Lt - work on alignment and form- significant improved technique Seated hamstring stretch 3x20 seconds  Resisted walking: 10# 4 ways x 6 each   07/14/23: Nustep x 6 min level 5 (PT present to discuss status) Seated figure 4 stretch 2x20 seconds  Standing rockerboard x2 min Sit to stand with 5# kettlebell; parallel feet and staggered stance x10 each  Forward T with min UE support on countertop x 10 each Trigger Point Dry Needling Subsequent Treatment: Instructions provided previously at initial dry needling treatment.   Patient Verbal Consent Given: Yes Education Handout Provided: Previously Provided Muscles Treated: Bil lumbar multifidi, Rt  and Rt gluteals  Electrical Stimulation Performed: No Treatment Response/Outcome: Utilized skilled palpation to identify bony landmarks and trigger points.  Able to illicit twitch response and muscle elongation.  Soft tissue mobilization to muscles needled following DN to further promote tissue elongation and decreased pain.  PATIENT EDUCATION:  Education details: [id number], aquatic info and DN info given Person educated: Patient Education method: Explanation, Demonstration, Verbal cues, and  Handouts Education comprehension: verbalized understanding and returned demonstration  HOME EXERCISE PROGRAM: Access Code: [email or web address] URL: [email or web address] Date: 07/12/2023 Prepared by: Loetta Ringer  Exercises - Supine Posterior Pelvic Tilt  - 1 x daily - 7 x weekly - 1 sets - 10 reps - Hooklying Single Knee to Chest Stretch  - 1 x daily - 7 x weekly - 1 sets - 10 reps - Supine Lower Trunk Rotation  - 1 x daily - 7 x weekly - 1 sets - 10 reps - Supine Double Knee to Chest  - 1 x daily - 7 x weekly - 1 sets - 10 reps - Seated Quadratus Lumborum Stretch in Chair  - 1 x daily - 7 x weekly - 1 sets - 2 reps - 20-30 hold - Seated Long Arc Quad  - 1 x daily - 7 x weekly - 1 sets - 10 reps - Clamshell  - 2 x daily - 7 x weekly - 2 sets - 10 reps - Sit to Stand Without Arm Support  - 2 x daily - 7 x weekly - 2 sets - 10 reps - Supine Hip Adduction Isometric with Ball  - 1 x daily - 7 x weekly - 2 sets - 10 reps - 5 hold - Side Stepping with Resistance at Ankles  - 1 x daily - 7  x weekly - 3 sets - 10 reps - Standing Clam with Resistance Loop  - 1 x daily - 7 x weekly - 3 sets - 10 reps - Standing Gastroc Stretch at Counter  - 3 x daily - 7 x weekly - 1 sets - 3 reps - 20 hold - Standing Gastroc Stretch on Step with Counter Support  - 3 x daily - 7 x weekly - 1 sets - 3 reps - 20 hold - Forward Step Down Touch with Heel  - 1 x daily - 7 x weekly - 2 sets - 10 reps - Forward T with Counter Support  - 1 x daily - 7 x weekly - 1-2 sets - 10 reps ASSESSMENT:  CLINICAL IMPRESSION: Pt was busy over the weekend and reports Rt knee fatigue and pain when she is fatigued.  She has remained consistent with flexibility and plans to return to the gym this week.  She continues to demonstrate significant improvement in negotiating steps due to improved Lt gastroc length.  Tactile and demo cues provided throughout session and monitored for pain.   Patient will benefit from skilled PT to address the  below impairments and improve overall function.    OBJECTIVE IMPAIRMENTS: decreased balance, decreased coordination, decreased mobility, decreased strength, increased muscle spasms, impaired flexibility, improper body mechanics, postural dysfunction, and pain.   ACTIVITY LIMITATIONS: bending, sitting, standing, squatting, sleeping, and stairs  PARTICIPATION LIMITATIONS: cleaning, shopping, and community activity  PERSONAL FACTORS: 1 comorbidity: Hx of Lt meniscus Sx and ongoing Lt heel pain are also affecting patient's functional outcome.   REHAB POTENTIAL: Excellent  CLINICAL DECISION MAKING: Stable/uncomplicated  EVALUATION COMPLEXITY: Moderate   GOALS: Goals reviewed with patient? Yes  SHORT TERM GOALS: Target date: 07/05/23  Pt will be ind with initial HEP Baseline: Goal status: MET  2.  Pt will demo good alignment and body mechanics for functional squat Baseline:  Goal status: MET  3.  Pt will have full symmetrical Lt SB resulting from improved Rt QL flexibility Baseline: full lumbar A/ROM (07/12/23) Goal status: MET  4. Pt will learn aquatic program over 1-2 visits to perform ind.  Baseline: (07/18/23)  Goal Status: MET    LONG TERM GOALS: Target date: 08/02/23  Pt will be ind with advanced HEP Baseline: HEP has been advanced HEP (07/12/23) Goal status: In progress   2.  Pt will improve ODI score to </= 24% to demo improved function Baseline: 17/50, 34% Goal status: INITIAL  3.  Pt will report improved balance confidence in busy environments such as with her grandkids. Baseline:  Goal status: In progress   4.  Pt will achieve 5/5 bil LE strength to improve functional task performance throughout day. Baseline:  Goal status: INITIAL  5.  Pt will report at least 70% reduction of pain in Rt lumbar spine and Rt LE. Baseline: 100% (07/12/23) Goal status: MET    PLAN:  PT FREQUENCY: 2x/week  PT DURATION: 8 weeks  PLANNED INTERVENTIONS: 97110-Therapeutic  exercises, 97530- Therapeutic activity, 97112- Neuromuscular re-education, 97535- Self Care, [address]- Manual therapy, [phone]- Aquatic Therapy, G0283- Electrical stimulation (unattended), [id number]- Ultrasound, 09811- Traction (mechanical), [id number]- Ionotophoresis 4mg /ml Dexamethasone, Patient/Family education, Balance training, Dry Needling, Joint mobilization, Spinal mobilization, Cryotherapy, and Moist heat.  PLAN FOR NEXT SESSION:steps, stability,  continue strength and flexibility of the hip and knee.  Work on steps. 2 more sessions probable  Luella Sager, PT 07/25/23 1:18 PM    [facility] [address], [address]  [place], [place] [address] Phone # [phone] Fax [phone]'

## 2023-07-28 ENCOUNTER — Ambulatory Visit

## 2023-07-28 DIAGNOSIS — M25551 Pain in right hip: Secondary | ICD-10-CM

## 2023-07-28 DIAGNOSIS — R252 Cramp and spasm: Secondary | ICD-10-CM

## 2023-07-28 DIAGNOSIS — M6281 Muscle weakness (generalized): Secondary | ICD-10-CM

## 2023-07-28 DIAGNOSIS — M5459 Other low back pain: Secondary | ICD-10-CM

## 2023-07-28 DIAGNOSIS — M25561 Pain in right knee: Secondary | ICD-10-CM

## 2023-07-28 NOTE — Therapy (Signed)
 OUTPATIENT PHYSICAL THERAPY THORACOLUMBAR TREATMENT NOTE   Patient Name: Jenna Caldwell MRN: [id number] DOB:28-Jun-1955, 68 y.o., female Today's Date: 07/28/2023   END OF SESSION:  PT End of Session - 07/28/23 1318     Visit Number 14    Date for PT Re-Evaluation 08/02/23    Authorization Type Medicare part A/B    Progress Note Due on Visit 20    PT Start Time 1235    PT Stop Time 1318    PT Time Calculation (min) 43 min    Activity Tolerance Patient tolerated treatment well    Behavior During Therapy WFL for tasks assessed/performed                    Past Medical History:  Diagnosis Date   Allergy    Sulfa and metformin severe reaction   Anxiety    Arthritis    Depression    Diabetes mellitus without complication (HCC) 2003   Pre diabetes   Hypertension    Osteopenia    Renal disease    Stage 3B CKD   Past Surgical History:  Procedure Laterality Date   ABLATION     Endometrial   APPENDECTOMY  1967   In sixth gradw   DILATION AND CURETTAGE OF UTERUS     KNEE SURGERY Left    Menisus   MOUTH SURGERY     08/2011   TONSILLECTOMY     Patient Active Problem List   Diagnosis Date Noted   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 06/24/2023   Chronic pain of right knee 06/24/2023   CKD (chronic kidney disease) stage 3, GFR 30-59 ml/min (HCC) 05/25/2023   Anxiety and depression 12/10/2022   Primary hypertension 12/10/2022    PCP: Rebecca Campus, NP  REFERRING PROVIDER: Syliva Even, MD  REFERRING DIAG: [phone] (ICD-10-CM) - Chronic pain of right knee M25.551 (ICD-10-CM) - Right hip pain M54.41,G89.29 (ICD-10-CM) - Chronic right-sided low back pain with right-sided sciatica  Rationale for Evaluation and Treatment: Rehabilitation  THERAPY DIAG:  Muscle weakness (generalized)  Cramp and spasm  Other low back pain  Pain in right hip  Acute pain of right knee  ONSET DATE: moved 5 mos ago and symptoms flared up - fell down  steps when carrying items  SUBJECTIVE:                                                                                                                                                                                           SUBJECTIVE STATEMENT:  My Rt hip is hurting a little bit today.  I went to [place] and walked,  rode the bike, stretched and did the leg press.   Initial eval: Pt moved 5mos ago and during packing process fell down steps which started Rt hip pain.  Also traveled in new RV to [place] which exacerbated pain and had another fall (backwards) onto Rt hip. Have been in RV for several months.  Tight spaces make for difficult getting around at times.  This all made me feel like maybe I need some PT.  I am realizing I am better when I am up and moving. Will be here in [place] until later in the summer when we will go out on our RV again.  Pain starts in Rt low back in a specific place and travels down the leg (Pt outlines the L4 nerve root pattern). Worse in the AM and better with movement but yet I can overdo it. I am afraid of falling - my knees are shaky and didn't used to be.  PERTINENT HISTORY:  Lt meniscus surgery 2008  PAIN:  PAIN:  07/28/23 Are you having pain? Yes  NPRS scale: 1-3/10 Pain location: Rt knee, Rt gluteal Pain orientation: Right  PAIN TYPE: stiff, aching Pain description: constant but level fluctuates Aggravating factors: worse in the AM, standing and walking  Relieving factors: movement but not overdoing, wears knee brace on Rt   PRECAUTIONS: Fall  RED FLAGS: None   WEIGHT BEARING RESTRICTIONS: No  FALLS:  Has patient fallen in last 6 months? Yes. Number of falls 2 - fell down stairs carrying box, fell adjusting shoe on pool deck  LIVING ENVIRONMENT: Lives with: lives with their spouse Lives in: House/apartment Stairs: Yes: Internal: 4 levels steps;   and External: none from garage steps;   Has following equipment at home:  None  OCCUPATION: retired  PLOF: Independent  PATIENT GOALS: reduce pain in hip, improve balance and strength, improve balance confidence  NEXT MD VISIT: 2 weeks  OBJECTIVE:  Note: Objective measures were completed at Evaluation unless otherwise noted.  DIAGNOSTIC FINDINGS:  MRI Rt knee: 07/09/23:  IMPRESSION: Mild degenerative arthrosis with chondromalacia and small reactive joint effusion. Mild prepatellar edema and small prepatellar collection.   Likely tear of the posterior horn and posterior body of the medial meniscus. Small centrally displaced flap may be present best seen on coronal images 14 and 15 and sagittal image 13.   2025 xrays: Pelvic/Hip IMPRESSION: 1. Mild bilateral hip osteoarthritis, left-greater-than-right. 2. Moderate pubic symphysis osteoarthritis.  Rt knee IMPRESSION: Mild-to-moderate medial compartment and patellofemoral compartment osteoarthritis.  Lumbar: IMPRESSION: Degenerative joint changes of lumbar spine.  PATIENT SURVEYS:  Modified Oswestry 34%   COGNITION: Overall cognitive status: Within functional limits for tasks assessed     SENSATION: WFL  MUSCLE LENGTH:   POSTURE: increased lumbar lordosis  PALPATION: Very tender: Rt side gluteals, QL, greater trochanter  LUMBAR ROM:   AROM eval  Flexion Full, HS stretch end range  Extension Full no pain  Right lateral flexion full  Left lateral flexion 75% compared to Rt flexion, stretch on Rt side  Right rotation WNL, Rt pain  Left rotation WNL   (Blank rows = not tested)  LOWER EXTREMITY ROM:    Bil hips WNL  Passive  Right eval Left eval  Hip flexion    Hip extension    Hip abduction    Hip adduction    Hip internal rotation    Hip external rotation    Knee flexion 125 with pain 125  Knee extension    Ankle dorsiflexion  Ankle plantarflexion    Ankle inversion    Ankle eversion     (Blank rows = not tested)  LOWER EXTREMITY MMT:   4/5 Rt hip abd and  ER Rt knee 4/5 with mild pain inhibition   FUNCTIONAL TESTS:  Challenged in SLS balance bil LE Able to squat but unsteady   TREATMENT DATE:   07/28/23: Nustep x 6 min level 5 (PT present to discuss status) Standing rockerboard x3 min Leg press: seat 6 60# 2x10, 40# Rt and Lt 2x10 each  Standing rockerboard x2 min  4 step down and 6 Rt and Lt - work on alignment and form- significant improved technique Seated hamstring stretch 3x20 seconds  Resisted walking: 10# 4 ways x 6 each  Bosu step-ups Rt and Lt forward x10 each    07/25/23: Nustep x 6 min level 5 (PT present to discuss status) Standing rockerboard x3 min Leg press: seat 6 60# 2x10, 40# Rt and Lt 2x10 each  Standing rockerboard x2 min  4 step down and 6 Rt and Lt - work on alignment and form- significant improved technique Seated hamstring stretch 3x20 seconds  Resisted walking: 10# 4 ways x 6 each  Gastroc stretch using edge of step 3x20 seconds bil   07/18/23: Nustep x 6 min level 5 (PT present to discuss status) Standing rockerboard x3 min Leg press: seat 6 60# 2x10, 40# Rt and Lt 2x10 each  Wall stretch with rockerboard x10 4 step down and 6 rt and Lt - work on alignment and form- significant improved technique Seated hamstring stretch 3x20 seconds  Resisted walking: 10# 4 ways x 6 each    PATIENT EDUCATION:  Education details: [email or web address], aquatic info and DN info given Person educated: Patient Education method: Explanation, Demonstration, Verbal cues, and Handouts Education comprehension: verbalized understanding and returned demonstration  HOME EXERCISE PROGRAM: Access Code: [email or web address] URL: [email or web address] Date: 07/12/2023 Prepared by: Loetta Ringer  Exercises - Supine Posterior Pelvic Tilt  - 1 x daily - 7 x weekly - 1 sets - 10 reps - Hooklying Single Knee to Chest Stretch  - 1 x daily - 7 x weekly - 1 sets - 10 reps - Supine Lower Trunk Rotation  - 1 x daily - 7 x weekly - 1 sets - 10  reps - Supine Double Knee to Chest  - 1 x daily - 7 x weekly - 1 sets - 10 reps - Seated Quadratus Lumborum Stretch in Chair  - 1 x daily - 7 x weekly - 1 sets - 2 reps - 20-30 hold - Seated Long Arc Quad  - 1 x daily - 7 x weekly - 1 sets - 10 reps - Clamshell  - 2 x daily - 7 x weekly - 2 sets - 10 reps - Sit to Stand Without Arm Support  - 2 x daily - 7 x weekly - 2 sets - 10 reps - Supine Hip Adduction Isometric with Ball  - 1 x daily - 7 x weekly - 2 sets - 10 reps - 5 hold - Side Stepping with Resistance at Ankles  - 1 x daily - 7 x weekly - 3 sets - 10 reps - Standing Clam with Resistance Loop  - 1 x daily - 7 x weekly - 3 sets - 10 reps - Standing Gastroc Stretch at Counter  - 3 x daily - 7 x weekly - 1 sets - 3 reps - 20 hold - Standing Gastroc Stretch on Step with Counter Support  -  3 x daily - 7 x weekly - 1 sets - 3 reps - 20 hold - Forward Step Down Touch with Heel  - 1 x daily - 7 x weekly - 2 sets - 10 reps - Forward T with Counter Support  - 1 x daily - 7 x weekly - 1-2 sets - 10 reps ASSESSMENT:  CLINICAL IMPRESSION: Pt was busy over the weekend and reports Rt knee fatigue and pain when she is fatigued.  Pt has been to the gym and has a good routine for flexibility and strength. She did well today with increased challenge of Bosu Ball.  She continues to demonstrate significant improvement in negotiating steps due to improved Lt gastroc length.  Tactile and demo cues provided throughout session and monitored for pain.   Patient will benefit from skilled PT to address the below impairments and improve overall function.    OBJECTIVE IMPAIRMENTS: decreased balance, decreased coordination, decreased mobility, decreased strength, increased muscle spasms, impaired flexibility, improper body mechanics, postural dysfunction, and pain.   ACTIVITY LIMITATIONS: bending, sitting, standing, squatting, sleeping, and stairs  PARTICIPATION LIMITATIONS: cleaning, shopping, and community  activity  PERSONAL FACTORS: 1 comorbidity: Hx of Lt meniscus Sx and ongoing Lt heel pain are also affecting patient's functional outcome.   REHAB POTENTIAL: Excellent  CLINICAL DECISION MAKING: Stable/uncomplicated  EVALUATION COMPLEXITY: Moderate   GOALS: Goals reviewed with patient? Yes  SHORT TERM GOALS: Target date: 07/05/23  Pt will be ind with initial HEP Baseline: Goal status: MET  2.  Pt will demo good alignment and body mechanics for functional squat Baseline:  Goal status: MET  3.  Pt will have full symmetrical Lt SB resulting from improved Rt QL flexibility Baseline: full lumbar A/ROM (07/12/23) Goal status: MET  4. Pt will learn aquatic program over 1-2 visits to perform ind.  Baseline: (07/18/23)  Goal Status: MET    LONG TERM GOALS: Target date: 08/02/23  Pt will be ind with advanced HEP Baseline: HEP has been advanced HEP (07/12/23) Goal status: In progress   2.  Pt will improve ODI score to </= 24% to demo improved function Baseline: 17/50, 34% Goal status: INITIAL  3.  Pt will report improved balance confidence in busy environments such as with her grandkids. Baseline:  Goal status: In progress   4.  Pt will achieve 5/5 bil LE strength to improve functional task performance throughout day. Baseline:  Goal status: INITIAL  5.  Pt will report at least 70% reduction of pain in Rt lumbar spine and Rt LE. Baseline: 100% (07/12/23) Goal status: MET    PLAN:  PT FREQUENCY: 2x/week  PT DURATION: 8 weeks  PLANNED INTERVENTIONS: 97110-Therapeutic exercises, 97530- Therapeutic activity, 97112- Neuromuscular re-education, 97535- Self Care, [address]- Manual therapy, [phone]- Aquatic Therapy, G0283- Electrical stimulation (unattended), [id number]- Ultrasound, 09811- Traction (mechanical), [id number]- Ionotophoresis 4mg /ml Dexamethasone, Patient/Family education, Balance training, Dry Needling, Joint mobilization, Spinal mobilization, Cryotherapy, and Moist heat.  PLAN  FOR NEXT SESSION:steps, stability,  continue strength and flexibility of the hip and knee.  Work on steps. 1 more sessions probable  Luella Sager, PT 07/28/23 1:19 PM    [facility] [address] [place], [place] [address] Phone # [phone] Fax [phone]'

## 2023-08-01 ENCOUNTER — Ambulatory Visit (HOSPITAL_BASED_OUTPATIENT_CLINIC_OR_DEPARTMENT_OTHER): Admitting: Orthopaedic Surgery

## 2023-08-02 ENCOUNTER — Ambulatory Visit

## 2023-08-02 ENCOUNTER — Other Ambulatory Visit: Payer: Self-pay | Admitting: Family Medicine

## 2023-08-02 DIAGNOSIS — M25551 Pain in right hip: Secondary | ICD-10-CM

## 2023-08-02 DIAGNOSIS — I10 Essential (primary) hypertension: Secondary | ICD-10-CM

## 2023-08-02 DIAGNOSIS — M6281 Muscle weakness (generalized): Secondary | ICD-10-CM | POA: Diagnosis not present

## 2023-08-02 DIAGNOSIS — R252 Cramp and spasm: Secondary | ICD-10-CM

## 2023-08-02 DIAGNOSIS — M5459 Other low back pain: Secondary | ICD-10-CM

## 2023-08-02 DIAGNOSIS — M25561 Pain in right knee: Secondary | ICD-10-CM

## 2023-08-02 NOTE — Therapy (Signed)
 OUTPATIENT PHYSICAL THERAPY THORACOLUMBAR TREATMENT NOTE   Patient Name: Jenna Caldwell MRN: [id number] DOB:01-Jun-1955, 68 y.o., female Today's Date: 08/02/2023   END OF SESSION:  PT End of Session - 08/02/23 1107     Visit Number 15    Date for PT Re-Evaluation 08/02/23    Authorization Type Medicare part A/B    Progress Note Due on Visit 20    PT Start Time 1102    PT Stop Time 1140    PT Time Calculation (min) 38 min    Activity Tolerance Patient tolerated treatment well    Behavior During Therapy WFL for tasks assessed/performed                     Past Medical History:  Diagnosis Date   Allergy    Sulfa and metformin severe reaction   Anxiety    Arthritis    Depression    Diabetes mellitus without complication (HCC) 2003   Pre diabetes   Hypertension    Osteopenia    Renal disease    Stage 3B CKD   Past Surgical History:  Procedure Laterality Date   ABLATION     Endometrial   APPENDECTOMY  1967   In sixth gradw   DILATION AND CURETTAGE OF UTERUS     KNEE SURGERY Left    Menisus   MOUTH SURGERY     08/2011   TONSILLECTOMY     Patient Active Problem List   Diagnosis Date Noted   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 06/24/2023   Chronic pain of right knee 06/24/2023   CKD (chronic kidney disease) stage 3, GFR 30-59 ml/min (HCC) 05/25/2023   Anxiety and depression 12/10/2022   Primary hypertension 12/10/2022    PCP: Rebecca Campus, NP  REFERRING PROVIDER: Syliva Even, MD  REFERRING DIAG: [phone] (ICD-10-CM) - Chronic pain of right knee M25.551 (ICD-10-CM) - Right hip pain M54.41,G89.29 (ICD-10-CM) - Chronic right-sided low back pain with right-sided sciatica  Rationale for Evaluation and Treatment: Rehabilitation  THERAPY DIAG:  Muscle weakness (generalized)  Cramp and spasm  Other low back pain  Pain in right hip  Acute pain of right knee  ONSET DATE: moved 5 mos ago and symptoms flared up - fell down  steps when carrying items  SUBJECTIVE:                                                                                                                                                                                           SUBJECTIVE STATEMENT:  My Rt knee is hurting more today. I have a good handle on what I  need to work on and [employer] working on Public librarian.    Initial eval: Pt moved 5mos ago and during packing process fell down steps which started Rt hip pain.  Also traveled in new RV to [place] which exacerbated pain and had another fall (backwards) onto Rt hip. Have been in RV for several months.  Tight spaces make for difficult getting around at times.  This all made me feel like maybe I need some PT.  I am realizing I am better when I am up and moving. Will be here in [place] until later in the summer when we will go out on our RV again.  Pain starts in Rt low back in a specific place and travels down the leg (Pt outlines the L4 nerve root pattern). Worse in the AM and better with movement but yet I can overdo it. I am afraid of falling - my knees are shaky and didn't used to be.  PERTINENT HISTORY:  Lt meniscus surgery 2008  PAIN:  PAIN:  08/02/23 Are you having pain? Yes  NPRS scale: 2/10 Pain location: Rt knee  Pain orientation: Right  PAIN TYPE: stiff, aching Pain description: constant but level fluctuates Aggravating factors: worse in the AM, standing and walking  Relieving factors: movement but not overdoing, wears knee brace on Rt   PRECAUTIONS: Fall  RED FLAGS: None   WEIGHT BEARING RESTRICTIONS: No  FALLS:  Has patient fallen in last 6 months? Yes. Number of falls 2 - fell down stairs carrying box, fell adjusting shoe on pool deck  LIVING ENVIRONMENT: Lives with: lives with their spouse Lives in: House/apartment Stairs: Yes: Internal: 4 levels steps;   and External: none from garage steps;   Has following equipment at home: None  OCCUPATION: retired  PLOF:  Independent  PATIENT GOALS: reduce pain in hip, improve balance and strength, improve balance confidence  NEXT MD VISIT: 2 weeks  OBJECTIVE:  Note: Objective measures were completed at Evaluation unless otherwise noted.  DIAGNOSTIC FINDINGS:  MRI Rt knee: 07/09/23:  IMPRESSION: Mild degenerative arthrosis with chondromalacia and small reactive joint effusion. Mild prepatellar edema and small prepatellar collection.   Likely tear of the posterior horn and posterior body of the medial meniscus. Small centrally displaced flap may be present best seen on coronal images 14 and 15 and sagittal image 13.   2025 xrays: Pelvic/Hip IMPRESSION: 1. Mild bilateral hip osteoarthritis, left-greater-than-right. 2. Moderate pubic symphysis osteoarthritis.  Rt knee IMPRESSION: Mild-to-moderate medial compartment and patellofemoral compartment osteoarthritis.  Lumbar: IMPRESSION: Degenerative joint changes of lumbar spine.  PATIENT SURVEYS:  Modified Oswestry 34%  08/02/23: 7/50=14%  COGNITION: Overall cognitive status: Within functional limits for tasks assessed     SENSATION: WFL  MUSCLE LENGTH:   POSTURE: increased lumbar lordosis  PALPATION: Very tender: Rt side gluteals, QL, greater trochanter  LUMBAR ROM:   AROM eval  Flexion Full, HS stretch end range  Extension Full no pain  Right lateral flexion full  Left lateral flexion 75% compared to Rt flexion, stretch on Rt side  Right rotation WNL, Rt pain  Left rotation WNL   (Blank rows = not tested)  LOWER EXTREMITY ROM:    Bil hips WNL  Passive  Right eval Left eval  Hip flexion    Hip extension    Hip abduction    Hip adduction    Hip internal rotation    Hip external rotation    Knee flexion 125 with pain 125  Knee extension  Ankle dorsiflexion    Ankle plantarflexion    Ankle inversion    Ankle eversion     (Blank rows = not tested)  LOWER EXTREMITY MMT:   4/5 Rt hip abd and ER Rt knee 4/5 with  mild pain inhibition   FUNCTIONAL TESTS:  Challenged in SLS balance bil LE Able to squat but unsteady   TREATMENT DATE:    08/02/23: Nustep x 6 min level 5 (PT present to discuss status) Standing rockerboard x3 min Leg press: seat 6 60# 2x10, 40# Rt and Lt 2x10 each  4 step down and 6 Rt and Lt - work on alignment and form- significant improved technique Seated hamstring stretch 3x20 seconds  Step-ups on balance pad x20   07/28/23: Nustep x 6 min level 5 (PT present to discuss status) Standing rockerboard x3 min Leg press: seat 6 60# 2x10, 40# Rt and Lt 2x10 each  Standing rockerboard x2 min  4 step down and 6 Rt and Lt - work on alignment and form- significant improved technique Seated hamstring stretch 3x20 seconds  Resisted walking: 10# 4 ways x 6 each  Bosu step-ups Rt and Lt forward x10 each    07/25/23: Nustep x 6 min level 5 (PT present to discuss status) Standing rockerboard x3 min Leg press: seat 6 60# 2x10, 40# Rt and Lt 2x10 each  Standing rockerboard x2 min  4 step down and 6 Rt and Lt - work on alignment and form- significant improved technique Seated hamstring stretch 3x20 seconds  Resisted walking: 10# 4 ways x 6 each  Gastroc stretch using edge of step 3x20 seconds bil   07/18/23: Nustep x 6 min level 5 (PT present to discuss status) Standing rockerboard x3 min Leg press: seat 6 60# 2x10, 40# Rt and Lt 2x10 each  Wall stretch with rockerboard x10 4 step down and 6 rt and Lt - work on alignment and form- significant improved technique Seated hamstring stretch 3x20 seconds  Resisted walking: 10# 4 ways x 6 each    PATIENT EDUCATION:  Education details: [id number], aquatic info and DN info given Person educated: Patient Education method: Explanation, Demonstration, Verbal cues, and Handouts Education comprehension: verbalized understanding and returned demonstration  HOME EXERCISE PROGRAM: Access Code: [email or web address] URL:  [email or web address] Date: 07/12/2023 Prepared by: Loetta Ringer  Exercises - Supine Posterior Pelvic Tilt  - 1 x daily - 7 x weekly - 1 sets - 10 reps - Hooklying Single Knee to Chest Stretch  - 1 x daily - 7 x weekly - 1 sets - 10 reps - Supine Lower Trunk Rotation  - 1 x daily - 7 x weekly - 1 sets - 10 reps - Supine Double Knee to Chest  - 1 x daily - 7 x weekly - 1 sets - 10 reps - Seated Quadratus Lumborum Stretch in Chair  - 1 x daily - 7 x weekly - 1 sets - 2 reps - 20-30 hold - Seated Long Arc Quad  - 1 x daily - 7 x weekly - 1 sets - 10 reps - Clamshell  - 2 x daily - 7 x weekly - 2 sets - 10 reps - Sit to Stand Without Arm Support  - 2 x daily - 7 x weekly - 2 sets - 10 reps - Supine Hip Adduction Isometric with Ball  - 1 x daily - 7 x weekly - 2 sets - 10 reps - 5 hold - Side Stepping with Resistance at Ankles  - 1 x  daily - 7 x weekly - 3 sets - 10 reps - Standing Clam with Resistance Loop  - 1 x daily - 7 x weekly - 3 sets - 10 reps - Standing Gastroc Stretch at Counter  - 3 x daily - 7 x weekly - 1 sets - 3 reps - 20 hold - Standing Gastroc Stretch on Step with Counter Support  - 3 x daily - 7 x weekly - 1 sets - 3 reps - 20 hold - Forward Step Down Touch with Heel  - 1 x daily - 7 x weekly - 2 sets - 10 reps - Forward T with Counter Support  - 1 x daily - 7 x weekly - 1-2 sets - 10 reps ASSESSMENT:  CLINICAL IMPRESSION: Pt reports full resolution of Rt hip and low back pain.  Rt knee pain remains that is intermittent.  She will see MD next week for her knee.  Pt is working to normalize gait pattern on the steps.   Pt will D/C to HEP today.   OBJECTIVE IMPAIRMENTS: decreased balance, decreased coordination, decreased mobility, decreased strength, increased muscle spasms, impaired flexibility, improper body mechanics, postural dysfunction, and pain.   ACTIVITY LIMITATIONS: bending, sitting, standing, squatting, sleeping, and stairs  PARTICIPATION LIMITATIONS:  cleaning, shopping, and community activity  PERSONAL FACTORS: 1 comorbidity: Hx of Lt meniscus Sx and ongoing Lt heel pain are also affecting patient's functional outcome.   REHAB POTENTIAL: Excellent  CLINICAL DECISION MAKING: Stable/uncomplicated  EVALUATION COMPLEXITY: Moderate   GOALS: Goals reviewed with patient? Yes  SHORT TERM GOALS: Target date: 07/05/23  Pt will be ind with initial HEP Baseline: Goal status: MET  2.  Pt will demo good alignment and body mechanics for functional squat Baseline:  Goal status: MET  3.  Pt will have full symmetrical Lt SB resulting from improved Rt QL flexibility Baseline: full lumbar A/ROM (07/12/23) Goal status: MET  4. Pt will learn aquatic program over 1-2 visits to perform ind.  Baseline: (07/18/23)  Goal Status: MET    LONG TERM GOALS: Target date: 08/02/23  Pt will be ind with advanced HEP Baseline: 08/02/23 Goal status: MET  2.  Pt will improve ODI score to </= 24% to demo improved function Baseline: 7/50=14%  Goal status:  MET  3.  Pt will report improved balance confidence in busy environments such as with her grandkids. Baseline:  Goal status: In progress   4.  Pt will achieve 5/5 bil LE strength to improve functional task performance throughout day. Baseline: met (08/02/23) Goal status: INITIAL  5.  Pt will report at least 70% reduction of pain in Rt lumbar spine and Rt LE. Baseline: 100% (07/12/23) Goal status: MET    PLAN:  PHYSICAL THERAPY DISCHARGE SUMMARY  Visits from Start of Care: 15  Current functional level related to goals / functional outcomes: Rt gluteal/lumbar pain has resolved completely.  Pt has continued Rt LE pain that is intermittent.  She will see orthopedic MD next week.     Remaining deficits: Rt knee pain and will see MD.  Gait and function are improved overall     Education / Equipment: HEP, steps, aquatic exercises    Patient agrees to discharge. Patient goals were met. Patient  is being discharged due to meeting the stated rehab goals.   Luella Sager, PT 08/02/23 11:47 AM    [facility] [address], [address] [place], [place] [address] Phone # [phone] Fax [phone]'

## 2023-08-08 ENCOUNTER — Encounter: Payer: Self-pay | Admitting: Orthopaedic Surgery

## 2023-08-08 ENCOUNTER — Ambulatory Visit (INDEPENDENT_AMBULATORY_CARE_PROVIDER_SITE_OTHER): Admitting: Orthopaedic Surgery

## 2023-08-08 VITALS — Ht 63.5 in | Wt 180.0 lb

## 2023-08-08 DIAGNOSIS — M25561 Pain in right knee: Secondary | ICD-10-CM | POA: Diagnosis not present

## 2023-08-08 DIAGNOSIS — G8929 Other chronic pain: Secondary | ICD-10-CM

## 2023-08-08 MED ORDER — LIDOCAINE HCL 1 % IJ SOLN
3.0000 mL | INTRAMUSCULAR | Status: AC | PRN
  Administered 2023-08-08: 3 mL

## 2023-08-08 MED ORDER — METHYLPREDNISOLONE ACETATE 40 MG/ML IJ SUSP
40.0000 mg | INTRAMUSCULAR | Status: AC | PRN
  Administered 2023-08-08: 40 mg via INTRA_ARTICULAR

## 2023-08-08 NOTE — Progress Notes (Signed)
 The patient is a 68 year old female that I am seeing for the first time only sent to me appropriately from Dr. Artist Lloyd to evaluate and treat her right knee.  She has been hurting for a few months now.  She had had a couple of falls after retiring and getting into traveling via RV with her husband.  She has remote history of a left knee arthroscopy that did well.  She has been dealing with right knee pain locking catching and swelling since then.  There are x-rays and a MRI of her right knee on the canopy system.  Examination her right knee today does show swelling of the right knee where her left knee appears to have no effusion and has excellent range of motion, her right knee lacks full extension by few degrees and has a painful arc of motion of the knee and again a mild effusion.  The MRI is reviewed with her and there is arthritic changes in the weightbearing surface of her right knee and some signal change in the meniscus but not really a flap type of tear or root tear.  There are areas of wear and cartilage in the knee in all 3 compartments but not full-thickness cartilage defect.  However there is some subchondral edema.  I was able to take off about 25 cc of clear fluid from her knee and then placed a steroid injection in her right knee which gave her immediate relief.  Her and her husband are traveling starting last week in July so I told her that when she gets back from that trip not to hesitate to reach out for us  to see her again to see how she responds to this injection and then can go from there in terms of what our next recommendations would be in terms of what type of surgical intervention may be needed depending on her level function and her signs and symptoms.  She agrees with the treatment plan.    Procedure Note  Patient: Danasia Baker             Date of Birth: 1946/02/17           MRN: [id number]             Visit Date: 08/08/2023  Procedures: Visit Diagnoses:  1. Chronic pain  of right knee     Large Joint Inj: R knee on 08/08/2023 1:55 PM Indications: diagnostic evaluation and pain Details: 22 G 1.5 in needle, superolateral approach  Arthrogram: No  Medications: 3 mL lidocaine 1 %; 40 mg methylPREDNISolone acetate 40 MG/ML Outcome: tolerated well, no immediate complications Procedure, treatment alternatives, risks and benefits explained, specific risks discussed. Consent was given by the patient. Immediately prior to procedure a time out was called to verify the correct patient, procedure, equipment, support staff and site/side marked as required. Patient was prepped and draped in the usual sterile fashion.

## 2023-08-25 ENCOUNTER — Other Ambulatory Visit: Payer: Self-pay | Admitting: Nephrology

## 2023-08-25 DIAGNOSIS — N1832 Chronic kidney disease, stage 3b: Secondary | ICD-10-CM

## 2023-08-26 LAB — LAB REPORT - SCANNED
Albumin, Urine POC: <3
Creatinine, POC: 34.4 mg/dL
EGFR: 33
Microalb Creat Ratio: <9

## 2023-08-29 ENCOUNTER — Inpatient Hospital Stay
Admission: RE | Admit: 2023-08-29 | Discharge: 2023-08-29 | Discharge status: Home / Self Care | Source: Ambulatory Visit | Attending: Nephrology

## 2023-08-29 DIAGNOSIS — N1832 Chronic kidney disease, stage 3b: Secondary | ICD-10-CM

## 2023-09-05 ENCOUNTER — Ambulatory Visit (INDEPENDENT_AMBULATORY_CARE_PROVIDER_SITE_OTHER): Admitting: Orthopaedic Surgery

## 2023-09-05 ENCOUNTER — Encounter: Payer: Self-pay | Admitting: Orthopaedic Surgery

## 2023-09-05 DIAGNOSIS — M25561 Pain in right knee: Secondary | ICD-10-CM | POA: Diagnosis not present

## 2023-09-05 DIAGNOSIS — G8929 Other chronic pain: Secondary | ICD-10-CM | POA: Diagnosis not present

## 2023-09-05 NOTE — Progress Notes (Signed)
 The patient is a 68 year old female well-known to us .  She had an aspiration of her knee and a steroid injection back in June.  She is helped a lot.  She is very active.  She has also been going to aquatic therapy and that is completed and she goes to [facility].  We talked about different types of braces that may help with her knee.  A lot of this could be a copper fit compression sleeve.  It is a matter of just finding what it fits.  She still has good days and bad days.  Her previous MRI of her right knee showed only mild to moderate arthritic changes and some signal change in the meniscus and she hurts mainly in the medial aspect of her knee.  Her and her husband are getting ready to go on a long trip on her RV across the country.  She cannot take anti-inflammatories due to kidney disease.  We talked today about her trying Voltaren gel for her knee 2-3 times a day and I think that will help along with some type of knee sleeve or brace.  She can continue work on Advice worker and the exercises.  She will hold off on any other type of steroid injection until she may be better for her trip and if the knee is bothering her enough.  Right now she is doing well.  All questions and concerns were answered addressed.  Follow-up can be as needed for now.

## 2023-09-09 ENCOUNTER — Ambulatory Visit: Admitting: Family Medicine

## 2023-09-14 MED ORDER — SERTRALINE HCL 50 MG PO TABS: 50.0000 mg | ORAL_TABLET | Freq: Every day | ORAL | 1 refills | Status: DC

## 2023-09-14 MED ORDER — METOPROLOL SUCCINATE ER 25 MG PO TB24: 25.0000 mg | ORAL_TABLET | Freq: Every day | ORAL | 1 refills | Status: DC

## 2023-09-14 NOTE — Telephone Encounter (Signed)
 New prescription sent per PCP.

## 2023-09-14 NOTE — Addendum Note (Signed)
 Addended by: Azriel Jakob on: 09/14/2023 11:22 AM   Modules accepted: Orders

## 2023-11-22 ENCOUNTER — Other Ambulatory Visit (HOSPITAL_BASED_OUTPATIENT_CLINIC_OR_DEPARTMENT_OTHER): Payer: Self-pay

## 2023-11-22 MED ORDER — COMIRNATY 30 MCG/0.3ML IM SUSY
0.3000 mL | PREFILLED_SYRINGE | Freq: Once | INTRAMUSCULAR | 0 refills | Status: AC
  Filled 2023-11-22: qty 0.3, 1d supply, fill #0

## 2023-11-22 MED ORDER — FLUZONE HIGH-DOSE 0.5 ML IM SUSY
0.5000 mL | PREFILLED_SYRINGE | Freq: Once | INTRAMUSCULAR | 0 refills | Status: AC
  Filled 2023-11-22: qty 0.5, 1d supply, fill #0

## 2023-12-06 ENCOUNTER — Telehealth: Payer: Self-pay | Admitting: *Deleted

## 2023-12-06 NOTE — Telephone Encounter (Signed)
 Copied from CRM #[id number]. Topic: Appointments - Transfer of Care >> Dec 06, 2023  1:13 PM Amy B wrote: Pt is requesting to transfer FROM: [name] Pt is requesting to transfer TO: [name] Reason for requested transfer: Referred to patient by another provider It is the responsibility of the team the patient would like to transfer to (Dr. [name]) to reach out to the patient if for any reason this transfer is not acceptable.

## 2023-12-06 NOTE — Telephone Encounter (Signed)
 Appt was scheduled for pt to transfer so we will keep appt as scheduled.

## 2023-12-14 ENCOUNTER — Other Ambulatory Visit: Payer: Self-pay | Admitting: Family Medicine

## 2023-12-19 ENCOUNTER — Encounter: Payer: Self-pay | Admitting: Radiology

## 2024-01-23 ENCOUNTER — Ambulatory Visit: Admitting: Orthopaedic Surgery

## 2024-01-23 DIAGNOSIS — G8929 Other chronic pain: Secondary | ICD-10-CM

## 2024-01-23 MED ORDER — LIDOCAINE HCL 1 % IJ SOLN
3.0000 mL | INTRAMUSCULAR | Status: AC | PRN
  Administered 2024-01-23: 3 mL

## 2024-01-23 MED ORDER — METHYLPREDNISOLONE ACETATE 40 MG/ML IJ SUSP
40.0000 mg | INTRAMUSCULAR | Status: AC | PRN
  Administered 2024-01-23: 40 mg via INTRA_ARTICULAR

## 2024-01-23 NOTE — Progress Notes (Signed)
 The patient is an active 68 year old female that we have seen in the past.  She has known tear of her medial meniscus with a small displaced flap component.  She has been able to tolerate this with an occasional aspiration of an effusion of her knee and a steroid injection.  Her and her husband travel quite a bit.  The last time we aspirated and injected her knee was back in July of this year.  She has a trip coming up this Thursday.  She has been having locking catching and pain in the back of her knee recently again with more mechanical symptoms.  On examination of the right knee today she has a positive McMurray's sign to the medial compartment of her knee and a moderate effusion.  We aspirated between 20 and 30 cc of fluid from her right knee and then placed a steroid injection in her right knee.  She has another trip again later in April and then in the summer.  She right now is going to consider just having injections and aspirations unless he gets to where she is significantly symptomatic and would proceed with a knee arthroscopy.  She knows that if her locking catching is worse and these injections fail to provide relief that we can proceed with an arthroscopic intervention.  She will let us  know.     Procedure Note  Patient: Jenna Caldwell             Date of Birth: 04/29/1955           MRN: [id number]             Visit Date: 01/23/2024  Procedures: Visit Diagnoses:  1. Chronic pain of right knee     Large Joint Inj: R knee on 01/23/2024 3:30 PM Indications: diagnostic evaluation and pain Details: 22 G 1.5 in needle, superolateral approach  Arthrogram: No  Medications: 3 mL lidocaine  1 %; 40 mg methylPREDNISolone  acetate 40 MG/ML Outcome: tolerated well, no immediate complications Procedure, treatment alternatives, risks and benefits explained, specific risks discussed. Consent was given by the patient. Immediately prior to procedure a time out was called to verify the correct  patient, procedure, equipment, support staff and site/side marked as required. Patient was prepped and draped in the usual sterile fashion.

## 2024-02-28 ENCOUNTER — Other Ambulatory Visit (HOSPITAL_BASED_OUTPATIENT_CLINIC_OR_DEPARTMENT_OTHER): Payer: Self-pay

## 2024-02-28 ENCOUNTER — Encounter (HOSPITAL_BASED_OUTPATIENT_CLINIC_OR_DEPARTMENT_OTHER): Payer: Self-pay | Admitting: Family Medicine

## 2024-02-28 ENCOUNTER — Encounter (HOSPITAL_BASED_OUTPATIENT_CLINIC_OR_DEPARTMENT_OTHER)

## 2024-02-28 ENCOUNTER — Ambulatory Visit (HOSPITAL_BASED_OUTPATIENT_CLINIC_OR_DEPARTMENT_OTHER)

## 2024-02-28 ENCOUNTER — Ambulatory Visit (INDEPENDENT_AMBULATORY_CARE_PROVIDER_SITE_OTHER): Admitting: Family Medicine

## 2024-02-28 ENCOUNTER — Encounter (HOSPITAL_BASED_OUTPATIENT_CLINIC_OR_DEPARTMENT_OTHER): Payer: Self-pay

## 2024-02-28 VITALS — BP 136/65 | HR 62 | Temp 98.2°F | Resp 18 | Ht 63.5 in | Wt 189.0 lb

## 2024-02-28 DIAGNOSIS — M79671 Pain in right foot: Secondary | ICD-10-CM | POA: Diagnosis not present

## 2024-02-28 DIAGNOSIS — I10 Essential (primary) hypertension: Secondary | ICD-10-CM | POA: Diagnosis not present

## 2024-02-28 DIAGNOSIS — R7303 Prediabetes: Secondary | ICD-10-CM | POA: Diagnosis not present

## 2024-02-28 DIAGNOSIS — N184 Chronic kidney disease, stage 4 (severe): Secondary | ICD-10-CM | POA: Insufficient documentation

## 2024-02-28 DIAGNOSIS — F321 Major depressive disorder, single episode, moderate: Secondary | ICD-10-CM

## 2024-02-28 MED ORDER — SERTRALINE HCL 50 MG PO TABS: 50.0000 mg | ORAL_TABLET | Freq: Every day | ORAL | 1 refills | Status: AC

## 2024-02-28 MED ORDER — METOPROLOL SUCCINATE ER 25 MG PO TB24: 25.0000 mg | ORAL_TABLET | Freq: Every day | ORAL | 1 refills | Status: AC

## 2024-02-28 NOTE — Assessment & Plan Note (Signed)
 Mostly controlled, still with some symptoms as PHQ-9 with score of 11 in office today.  She feels to be doing well with sertraline  at current dose, can continue with 50 mg dose.  Could consider adjusting dose pending progress with medication

## 2024-02-28 NOTE — Assessment & Plan Note (Signed)
 Noted on prior labs, last A1c at 6.2%.  No signs or symptoms today indicating overt diabetes We can monitor A1c with labs before next appointment

## 2024-02-28 NOTE — Progress Notes (Signed)
 "  New Patient Office Visit  Subjective   Patient ID: Jenna Caldwell, female    DOB: 11-03-1955  Age: 69 y.o. MRN: [id number]  CC:  Chief Complaint  Patient presents with   Establish Care   Knee Pain   Flank Pain   Hypertension    HPI Jenna Caldwell presents to establish care Last PCP - Philippe Slade - was supposed to follow-up in June 2025 but did not return.  Discussed the use of AI scribe software for clinical note transcription with the patient, who gave verbal consent to proceed.  History of Present Illness Jenna Caldwell is a 69 year old female with chronic kidney disease who presents for follow-up of her kidney condition and management of hypertension and knee pain.  She has chronic kidney disease and is under the care of a nephrologist at [facility], with follow-ups every three months. She recently moved to [facility] and had not seen a kidney doctor until recently. She brought her last reports and lab work for review and has upcoming lab work scheduled.  She has a history of hypertension, previously managed with lisinopril, which was discontinued due to hypotension. She is currently taking metoprolol . She also tried amlodipine  but discontinued it for unspecified reasons. She experiences 'white coat syndrome' and notes her blood pressure readings at home are typically lower than those recorded in the office. She has documented her blood pressure readings over two weeks, noting that the highest at home is around 140s, while in the office it can reach 160s.  She reports chronic right knee pain and has a history of a meniscal injury and possible arthritis, as discussed with her orthopedic specialists. She has been seeing orthopedic specialists and has undergone physical therapy. Her knee was drained before a trip, which provided relief. She describes her pain as constant but variable, exacerbated by activity, and notes difficulty in rating her pain due to its persistent nature.  She has a history of a fall at a swimming pool, which resulted in hip pain, for which she briefly took a medication that provided significant relief.  She is currently taking sertraline , having increased her dose from 25 mg to 50 mg due to recent life changes. She has a history of fainting in the shower during a period of significant weight loss and hypotension, which led to a hospital visit and cardiac evaluation, though no significant findings were reported.  She reports a new onset of pain in her foot, which began approximately three weeks ago. She describes the pain as localized to a specific spot and initially thought it might be related to uric acid or gout, but it has persisted. She notes the pain is influenced by the type of shoes she wears.  She moved to [place] during the pandemic in 2022 and has been RVing and exercising with her husband. She enjoys reading, playing Mahjong, and spending time with her two local grandchildren.  Patient is originally from [place] , moved here from [place] . Moved here in 2022. She enjoys RVing, plays Mahjong, riding bikes, reading.  Outpatient Encounter Medications as of 02/28/2024  Medication Sig   Calcium 200 MG TABS Take 2 tablets by mouth daily at 12 noon.   Cholecalciferol (VITAMIN D3 PO) Take 1 tablet by mouth daily at 12 noon.   Cyanocobalamin (VITAMIN B 12 PO) Take 1 tablet by mouth daily at 12 noon.   tiZANidine  (ZANAFLEX ) 2 MG tablet Take 1-2 tablets (2-4 mg total) by mouth every 8 (eight) hours as  needed.   [DISCONTINUED] metoprolol  succinate (TOPROL -XL) 25 MG 24 hr tablet TAKE 1 TABLET(25 MG) BY MOUTH DAILY   [DISCONTINUED] sertraline  (ZOLOFT ) 50 MG tablet TAKE 1 TABLET(50 MG) BY MOUTH DAILY   metoprolol  succinate (TOPROL -XL) 25 MG 24 hr tablet Take 1 tablet (25 mg total) by mouth daily.   sertraline  (ZOLOFT ) 50 MG tablet Take 1 tablet (50 mg total) by mouth daily.   No facility-administered encounter medications on file as of  02/28/2024.    Past Medical History:  Diagnosis Date   Allergy    Sulfa and metformin severe reaction   Anxiety    Arthritis    Depression    Diabetes mellitus without complication (HCC) 2003   Pre diabetes   Hypertension    Osteopenia    Renal disease    Stage 3B CKD    Past Surgical History:  Procedure Laterality Date   ABLATION     Endometrial   APPENDECTOMY  1967   In sixth gradw   DILATION AND CURETTAGE OF UTERUS     KNEE SURGERY Left    Menisus   MOUTH SURGERY     08/2011   TONSILLECTOMY      Family History  Problem Relation Age of Onset   Kidney disease Mother    Arthritis Mother    Depression Mother    Diabetes Mother    Heart disease Mother    Hypertension Mother    Heart attack Father    Emphysema Father    Diabetes Sister    Anxiety disorder Sister    Cancer Sister    Diabetes Sister    Heart attack Maternal Grandmother    Stroke Paternal Grandfather     Social History   Socioeconomic History   Marital status: Married    Spouse name: Not on file   Number of children: 3   Years of education: Not on file   Highest education level: Doctorate  Occupational History   Not on file  Tobacco Use   Smoking status: Never    Passive exposure: Never   Smokeless tobacco: Never  Vaping Use   Vaping status: Never Used  Substance and Sexual Activity   Alcohol use: Yes    Comment: Twice a month- 1 glass of wine   Drug use: Never   Sexual activity: Yes    Birth control/protection: Post-menopausal  Other Topics Concern   Not on file  Social History Narrative   Not on file   Social Drivers of Health   Tobacco Use: Low Risk (02/28/2024)   Patient History    Smoking Tobacco Use: Never    Smokeless Tobacco Use: Never    Passive Exposure: Never  Financial Resource Strain: Low Risk (02/23/2024)   Overall Financial Resource Strain (CARDIA)    Difficulty of Paying Living Expenses: Not hard at all  Food Insecurity: No Food Insecurity (02/23/2024)   Epic     Worried About Radiation Protection Practitioner of Food in the Last Year: Never true    Ran Out of Food in the Last Year: Never true  Transportation Needs: No Transportation Needs (02/23/2024)   Epic    Lack of Transportation (Medical): No    Lack of Transportation (Non-Medical): No  Physical Activity: Insufficiently Active (02/23/2024)   Exercise Vital Sign    Days of Exercise per Week: 3 days    Minutes of Exercise per Session: 40 min  Stress: Stress Concern Present (02/23/2024)   [employer] of Occupational Health - Occupational Stress Questionnaire  Feeling of Stress: Rather much  Social Connections: Socially Integrated (02/23/2024)   Social Connection and Isolation Panel    Frequency of Communication with Friends and Family: More than three times a week    Frequency of Social Gatherings with Friends and Family: Patient declined    Attends Religious Services: More than 4 times per year    Active Member of [employer] or Organizations: Yes    Attends Banker Meetings: 1 to 4 times per year    Marital Status: Married  Catering Manager Violence: Not At Risk (07/14/2023)   Humiliation, Afraid, Rape, and Kick questionnaire    Fear of Current or Ex-Partner: No    Emotionally Abused: No    Physically Abused: No    Sexually Abused: No  Depression (PHQ2-9): Medium Risk (07/14/2023)   Depression (PHQ2-9)    PHQ-2 Score: 7  Alcohol Screen: Low Risk (02/23/2024)   Alcohol Screen    Last Alcohol Screening Score (AUDIT): 2  Housing: Low Risk (02/23/2024)   Epic    Unable to Pay for Housing in the Last Year: No    Number of Times Moved in the Last Year: 0    Homeless in the Last Year: No  Utilities: Not At Risk (07/14/2023)   A[facility] Utilities    Threatened with loss of utilities: No  Health Literacy: Adequate Health Literacy (07/14/2023)   B1300 Health Literacy    Frequency of need for help with medical instructions: Never    Objective   BP 136/65 (BP Location: Left Arm, Patient Position: Sitting,  Cuff Size: Large)   Pulse 62   Temp 98.2 F (36.8 C) (Oral)   Resp 18   Ht 5' 3.5 (1.613 m)   Wt 189 lb (85.7 kg)   SpO2 100%   BMI 32.95 kg/m   Physical Exam  69 year old female in no acute distress Cardiovascular exam with regular rate and rhythm Lungs clear to auscultation bilaterally Right foot with slight bony prominence over superior aspect of proximal portion of fifth metatarsal.  She does not have any tenderness to palpation at proximal fifth metatarsal, however does have some tenderness to palpation over bony prominence which is slightly more distal.  No overlying erythema, normal passive and active range of motion of right ankle.  Assessment & Plan:   Primary hypertension Assessment & Plan: Blood pressure is elevated on initial reading, with elevated systolic reading.  Diastolic is at goal.  Home blood pressure log is better controlled with systolic ranging typically from 120-140.  Diastolic has been at goal at home. Recommend intermittent monitoring of blood pressure at home, DASH diet.  We can continue with can continue with metoprolol  at this time although it is not as strong of an antihypertensive as other potential options.  She was on amlodipine  at 1 point in the past, uncertain as to why this medication was stopped.  If needing to better control blood pressure in the future, likely would consider amlodipine  as next agent to incorporate into treatment plan  Orders: -     Metoprolol  Succinate ER; Take 1 tablet (25 mg total) by mouth daily.  Dispense: 90 tablet; Refill: 1 -     CBC with Differential/Platelet; Future -     Comprehensive metabolic panel with GFR; Future -     Lipid panel; Future  Prediabetes Assessment & Plan: Noted on prior labs, last A1c at 6.2%.  No signs or symptoms today indicating overt diabetes We can monitor A1c with labs before next appointment  Orders: -     Hemoglobin A1c; Future -     Lipid panel; Future  Right foot pain Assessment &  Plan: Uncertain cause, she does have bony tenderness with bony prominence noted.  Given history and exam, would be reasonable to proceed with x-rays to exclude underlying bony abnormality.  Orders: -     DG Foot Complete Right; Future  Current moderate episode of major depressive disorder, unspecified whether recurrent (HCC) Assessment & Plan: Mostly controlled, still with some symptoms as PHQ-9 with score of 11 in office today.  She feels to be doing well with sertraline  at current dose, can continue with 50 mg dose.  Could consider adjusting dose pending progress with medication   Chronic kidney disease (CKD), stage 4 (HCC) Assessment & Plan: Noted on history.  She does follow with [facility], recommend continuing with follow-up.  We will need to be mindful of underlying kidney disease related to management of other conditions.  Discussed importance of blood pressure control.  We will also monitor blood sugar control   Other orders -     Sertraline  HCl; Take 1 tablet (50 mg total) by mouth daily.  Dispense: 90 tablet; Refill: 1  Return in about 5 months (around 07/28/2024) for hypertension, prediabetes, with fasting labs 1 week prior.    ___________________________________________ Keston Seever de [place], MD, ABFM, CAQSM Primary Care and Sports Medicine [facility] "

## 2024-02-28 NOTE — Assessment & Plan Note (Signed)
 Uncertain cause, she does have bony tenderness with bony prominence noted.  Given history and exam, would be reasonable to proceed with x-rays to exclude underlying bony abnormality.

## 2024-02-28 NOTE — Assessment & Plan Note (Signed)
 Blood pressure is elevated on initial reading, with elevated systolic reading.  Diastolic is at goal.  Home blood pressure log is better controlled with systolic ranging typically from 120-140.  Diastolic has been at goal at home. Recommend intermittent monitoring of blood pressure at home, DASH diet.  We can continue with can continue with metoprolol  at this time although it is not as strong of an antihypertensive as other potential options.  She was on amlodipine  at 1 point in the past, uncertain as to why this medication was stopped.  If needing to better control blood pressure in the future, likely would consider amlodipine  as next agent to incorporate into treatment plan

## 2024-02-28 NOTE — Assessment & Plan Note (Signed)
 Noted on history.  She does follow with [facility], recommend continuing with follow-up.  We will need to be mindful of underlying kidney disease related to management of other conditions.  Discussed importance of blood pressure control.  We will also monitor blood sugar control

## 2024-03-13 ENCOUNTER — Other Ambulatory Visit: Payer: Self-pay | Admitting: Family Medicine

## 2024-03-21 ENCOUNTER — Ambulatory Visit (HOSPITAL_BASED_OUTPATIENT_CLINIC_OR_DEPARTMENT_OTHER): Payer: Self-pay | Admitting: Family Medicine

## 2024-07-23 ENCOUNTER — Ambulatory Visit (HOSPITAL_BASED_OUTPATIENT_CLINIC_OR_DEPARTMENT_OTHER)

## 2024-07-30 ENCOUNTER — Ambulatory Visit (HOSPITAL_BASED_OUTPATIENT_CLINIC_OR_DEPARTMENT_OTHER): Admitting: Family Medicine
